# Patient Record
Sex: Male | Born: 1985 | Race: White | Hispanic: No | Marital: Married | Smoking: Current every day smoker
Health system: Southern US, Community
[De-identification: ages and names within clinical notes are randomized; demographics above are authoritative.]

## PROBLEM LIST (undated history)

## (undated) DIAGNOSIS — I10 Essential (primary) hypertension: Secondary | ICD-10-CM

---

## 2015-11-14 ENCOUNTER — Emergency Department (HOSPITAL_COMMUNITY)
Admission: EM | Admit: 2015-11-14 | Discharge: 2015-11-14 | Disposition: A | Discharge status: Home / Self Care | Payer: Worker's Compensation | Attending: Emergency Medicine | Admitting: Emergency Medicine

## 2015-11-14 ENCOUNTER — Encounter (HOSPITAL_COMMUNITY): Payer: Self-pay | Admitting: *Deleted

## 2015-11-14 DIAGNOSIS — F17210 Nicotine dependence, cigarettes, uncomplicated: Secondary | ICD-10-CM | POA: Diagnosis not present

## 2015-11-14 DIAGNOSIS — Y9289 Other specified places as the place of occurrence of the external cause: Secondary | ICD-10-CM | POA: Diagnosis not present

## 2015-11-14 DIAGNOSIS — Y9389 Activity, other specified: Secondary | ICD-10-CM | POA: Diagnosis not present

## 2015-11-14 DIAGNOSIS — S0990XA Unspecified injury of head, initial encounter: Secondary | ICD-10-CM

## 2015-11-14 DIAGNOSIS — W228XXA Striking against or struck by other objects, initial encounter: Secondary | ICD-10-CM | POA: Insufficient documentation

## 2015-11-14 DIAGNOSIS — Y998 Other external cause status: Secondary | ICD-10-CM | POA: Diagnosis not present

## 2015-11-14 NOTE — ED Provider Notes (Signed)
CSN: [id number]     Arrival date & time 11/14/15  1016 History  By signing my name below, I, Ronney LionSuzanne Le, attest that this documentation has been prepared under the direction and in the presence of Holly Iannaccone, PA-C. Electronically Signed: Ronney LionSuzanne Le, ED Scribe. 11/14/2015. 1:59 PM.    Chief Complaint  Patient presents with  . Head Injury   The history is provided by the patient. No language interpreter was used.   HPI Comments: Marc Cole is a 29 y.o. male with no pertinent PMHx, who presents to the Emergency Department S/P accidentally hitting the posterior and top of his head on a window while working in a crawl space at work. Patient states that shortly after the injury, clear-yellow-red fluid ran out of his right nostril 3 times after bending over to pick something up, which concerned him enough to come to the ED for evaluation. He states that his symptoms have since resolved. He denies LOC, visual changes, or neck pain. He states this is a new problem. He notes a history of seasonal allergies but has not noticed any rhinorrhea. Patient is here with his boss from work who requests urine sample for workers compensation.   History reviewed. No pertinent past medical history. History reviewed. No pertinent past surgical history. No family history on file. Social History  Substance Use Topics  . Smoking status: Current Some Day Smoker -- 0.01 packs/day    Types: Cigarettes  . Smokeless tobacco: None  . Alcohol Use: Yes     Comment: occassional    Review of Systems  HENT: Negative for rhinorrhea.   Eyes: Negative for visual disturbance.  Musculoskeletal: Negative for neck pain.      Allergies  Review of patient's allergies indicates no known allergies.  Home Medications   Prior to Admission medications   Not on File   BP 140/93 mmHg  Pulse 105  Temp(Src) 97.8 F (36.6 C) (Oral)  Resp 18  SpO2 100% Physical Exam  Constitutional: He is oriented to person, place,  and time. He appears well-developed and well-nourished. No distress.  HENT:  Head: Normocephalic and atraumatic.  Nose: Nose normal.  No hematoma. No epistaxis. No nasal mucosal edema.   Eyes: Conjunctivae are normal. Pupils are equal, round, and reactive to light. Right eye exhibits no discharge. Left eye exhibits no discharge. No scleral icterus.  Neck: Normal range of motion. Neck supple.  No midline spinal tenderness.  Cardiovascular: Normal rate.   Pulmonary/Chest: Effort normal.  Lymphadenopathy:    He has no cervical adenopathy.  Neurological: He is alert and oriented to person, place, and time. No cranial nerve deficit. He exhibits normal muscle tone. Coordination normal.  Strength 5/5 throughout. No sensory deficits.  No gait abnormality. Normal finger to nose. Normal heel to shin.   Skin: Skin is warm and dry. No rash noted. He is not diaphoretic. No erythema. No pallor.  Psychiatric: He has a normal mood and affect. His behavior is normal.  Nursing note and vitals reviewed.   ED Course  Procedures (including critical care time)  DIAGNOSTIC STUDIES: Oxygen Saturation is 100% on RA, normal by my interpretation.    COORDINATION OF CARE: 1:12 PM - Suspect sinus drainage. Pt reassured.    MDM   Final diagnoses:  Head injury, initial encounter   Pt presents after striking his head while crawling out of a  Crawl space at work. No LOC, Head is atraumatic. Denies headache. Low mechanism of injury. Cervical spine is non-tender  with normal ROM. No epistaxis or mucosal edema. No advanced imaging indicated at this time. Pt appears well in ED, non-toxic. Alert and Oriented x4. Feel that pt is safe for discarge with PCP follow up. Return precautions outlined in patient discharge instructions.    I personally performed the services described in this documentation, which was scribed in my presence. The recorded information has been reviewed and is accurate.       Lester Kinsman  [place], PA-C 11/15/15 1214  Cathren Laine, MD 11/16/15 [phone]

## 2015-11-14 NOTE — ED Notes (Signed)
Pt reports hitting head on window today while at work, pt denies LOC, pt denies blurred vision, pt reports mod amt of clear & yellowish drainage from nose x 3 post hitting, pt denies neck & back pain, pt A&O x4, follows commands, speaks in complete sentences, pt moves all extremities, no bleeding, swelling or bruising noted to the location the pt hit his head, pt denies bowel & bladder incontinence

## 2015-11-14 NOTE — Discharge Instructions (Signed)
Head Injury, Adult You have a head injury. Headaches and throwing up (vomiting) are common after a head injury. It should be easy to wake up from sleeping. Sometimes you must stay in the hospital. Most problems happen within the first 24 hours. Side effects may occur up to 7-10 days after the injury.  WHAT ARE THE TYPES OF HEAD INJURIES? Head injuries can be as minor as a bump. Some head injuries can be more severe. More severe head injuries include:  A jarring injury to the brain (concussion).  A bruise of the brain (contusion). This mean there is bleeding in the brain that can cause swelling.  A cracked skull (skull fracture).  Bleeding in the brain that collects, clots, and forms a bump (hematoma). WHEN SHOULD I GET HELP RIGHT AWAY?   You are confused or sleepy.  You cannot be woken up.  You feel sick to your stomach (nauseous) or keep throwing up (vomiting).  Your dizziness or unsteadiness is getting worse.  You have very bad, lasting headaches that are not helped by medicine. Take medicines only as told by your doctor.  You cannot use your arms or legs like normal.  You cannot walk.  You notice changes in the black spots in the center of the colored part of your eye (pupil).  You have clear or bloody fluid coming from your nose or ears.  You have trouble seeing. During the next 24 hours after the injury, you must stay with someone who can watch you. This person should get help right away (call 911 in the [place] if you start to shake and are not able to control it (have seizures), you pass out, or you are unable to wake up. HOW CAN I PREVENT A HEAD INJURY IN THE FUTURE?  Wear seat belts.  Wear a helmet while bike riding and playing sports like football.  Stay away from dangerous activities around the house. WHEN CAN I RETURN TO NORMAL ACTIVITIES AND ATHLETICS? See your doctor before doing these activities. You should not do normal activities or play contact sports until 1  week after the following symptoms have stopped:  Headache that does not go away.  Dizziness.  Poor attention.  Confusion.  Memory problems.  Sickness to your stomach or throwing up.  Tiredness.  Fussiness.  Bothered by bright lights or loud noises.  Anxiousness or depression.  Restless sleep. MAKE SURE YOU:   Understand these instructions.  Will watch your condition.  Will get help right away if you are not doing well or get worse.   This information is not intended to replace advice given to you by your health care provider. Make sure you discuss any questions you have with your health care provider.  Follow up with your primary care provider if your symptoms do not improve. Return to the ED if you experience severe headache, blurry vision, loss of consciousness, severe nose bleed, facial pain, numbness/tingling in an extremity.

## 2015-11-14 NOTE — ED Notes (Signed)
See PA assessment 

## 2017-03-30 ENCOUNTER — Emergency Department (INDEPENDENT_AMBULATORY_CARE_PROVIDER_SITE_OTHER)
Admission: EM | Admit: 2017-03-30 | Discharge: 2017-03-30 | Disposition: A | Discharge status: Home / Self Care | Payer: Managed Care, Other (non HMO) | Source: Home / Self Care | Attending: Family Medicine | Admitting: Family Medicine

## 2017-03-30 ENCOUNTER — Encounter: Payer: Self-pay | Admitting: Emergency Medicine

## 2017-03-30 DIAGNOSIS — L03114 Cellulitis of left upper limb: Secondary | ICD-10-CM | POA: Diagnosis not present

## 2017-03-30 LAB — POCT CBC W AUTO DIFF (K'VILLE URGENT CARE)

## 2017-03-30 MED ORDER — TRIAMCINOLONE ACETONIDE 0.1 % EX CREA: 1.0000 "application " | TOPICAL_CREAM | Freq: Two times a day (BID) | CUTANEOUS | 0 refills | Status: DC

## 2017-03-30 MED ORDER — CLINDAMYCIN HCL 300 MG PO CAPS: 300.0000 mg | ORAL_CAPSULE | Freq: Three times a day (TID) | ORAL | 0 refills | Status: AC

## 2017-03-30 NOTE — ED Triage Notes (Signed)
[facility] with a complaint of rash on both arms, redness around wrist and elbows, itching present. C/o arm swelling

## 2017-03-30 NOTE — Discharge Instructions (Signed)
Stop cephalexin and begin Clindamycin. May take Benadryl at bedtime for itching if needed.

## 2017-03-30 NOTE — ED Provider Notes (Signed)
[facility]    CSN: [id number] Arrival date & time: 03/30/17  1411     History   Chief Complaint Chief Complaint  Patient presents with  . Rash    HPI Ashley MarinerKevin Lindeman is a 31 y.o. male.   Patient complains of a persistent pruritic rash on both forearms and wrists that started about 8 days ago.  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  He visited a local urgent care 1 week ago where he received an injection of Decadron 10mg  and Kenalog 40mg , and prescribed triamcinolone cream.  His rash improved somewhat, and then became worse with increased warmth and erythema.  He returned to urgent care where he received an injection of Rocephin and started on Keflex 500mg  QID.  He was advised to continue to apply triamcinolone cream.  He reports that that the rash has improved but is still erythematous and itches.  He feels well otherwise, and denies fevers, chills, and sweats.   The history is provided by the patient.  Rash  Location: both forearms. Quality: dryness, itchiness and redness   Quality: not blistering, not draining, not painful, not swelling and not weeping   Severity:  Moderate Onset quality:  Gradual Duration:  8 days Timing:  Constant Progression:  Improving Chronicity:  New Context: plant contact   Context: not animal contact, not chemical exposure, not insect bite/sting, not medications and not new detergent/soap   Relieved by:  Nothing Ineffective treatments:  Topical steroids Associated symptoms: no abdominal pain, no diarrhea, no fatigue, no fever, no headaches, no induration, no joint pain, no myalgias and no nausea     History reviewed. No pertinent past medical history.  There are no active problems to display for this patient.   History reviewed. No pertinent surgical history.     Home Medications    Prior to Admission medications     Medication Sig Start Date End Date Taking? Authorizing Provider  clindamycin (CLEOCIN) 300 MG capsule Take 1 capsule (300 mg total) by mouth 3 (three) times daily. 03/30/17 04/09/17  Lattie HawBeese, Freddi Forster A, MD  triamcinolone cream (KENALOG) 0.1 % Apply 1 application topically 2 (two) times daily. (Use as needed for itching) 03/30/17   Lattie HawBeese, Veralyn Lopp A, MD    Family History History reviewed. No pertinent family history.  Social History Social History  Substance Use Topics  . Smoking status: Current Some Day Smoker    Packs/day: 0.01    Types: Cigarettes  . Smokeless tobacco: Current User  . Alcohol use Yes     Comment: occassional     Allergies   Patient has no known allergies.   Review of Systems Review of Systems  Constitutional: Negative for activity change, appetite change, chills, diaphoresis, fatigue and fever.  HENT: Negative.   Eyes: Negative.   Respiratory: Negative.   Cardiovascular: Negative.   Gastrointestinal: Negative for abdominal pain, diarrhea and nausea.  Genitourinary: Negative.   Musculoskeletal: Negative for arthralgias and myalgias.  Skin: Positive for rash.  Neurological: Negative for headaches.  All other systems reviewed and are negative.    Physical Exam Triage Vital Signs ED Triage Vitals  Enc Vitals Group     BP 03/30/17 1445 133/83     Pulse Rate 03/30/17 1445 (!) 107     Resp --      Temp 03/30/17 1445 98.6 F (37 C)  Temp Source 03/30/17 1445 Oral     SpO2 03/30/17 1445 99 %     Weight --      Height --      Head Circumference --      Peak Flow --      Pain Score 03/30/17 1446 0     Pain Loc --      Pain Edu? --      Excl. in GC? --    No data found.   Updated Vital Signs BP 133/83 (BP Location: Right Arm)   Pulse (!) 107   Temp 98.6 F (37 C) (Oral)   SpO2 99%   Visual Acuity Right Eye Distance:   Left Eye Distance:   Bilateral Distance:    Right Eye Near:   Left Eye Near:    Bilateral Near:     Physical Exam   Constitutional: He appears well-developed and well-nourished. No distress.  HENT:  Head: Normocephalic.  Right Ear: External ear normal.  Left Ear: External ear normal.  Nose: Nose normal.  Mouth/Throat: Oropharynx is clear and moist.  Eyes: Conjunctivae are normal. Pupils are equal, round, and reactive to light.  Neck: Neck supple.  Cardiovascular: Regular rhythm.   Note tacycardia rate 107  Pulmonary/Chest: Effort normal.  Musculoskeletal:       Right forearm: He exhibits no tenderness and no swelling.       Arms: Left forearm has light erythema over volar surface extending to antecubital fossa and biceps area.  There is mild warmth but no tenderness, induration, or fluctuance. Right forearm has minimal erythema present.  Lymphadenopathy:    He has no cervical adenopathy.  Neurological: He is alert.  Skin: Skin is warm and dry.  Nursing note and vitals reviewed.    UC Treatments / Results  Labs (all labs ordered are listed, but only abnormal results are displayed) Labs Reviewed  POCT CBC W AUTO DIFF ([facility]):  WBC 11.9; LY 17.6; MO 2.2; GR 80.2; Hgb 15.5; Platelets 273     EKG  EKG Interpretation None       Radiology No results found.  Procedures Procedures (including critical care time)  Medications Ordered in UC Medications - No data to display   Initial Impression / Assessment and Plan / UC Course  I have reviewed the triage vital signs and the nursing notes.  Pertinent labs & imaging results that were available during my care of the patient were reviewed by me and considered in my medical decision making (see chart for details).    Note leukocytosis 11.9.  Concern for partially treated possible staph cellulitis. Stop cephalexin and begin Clindamycin for staph coverage. Refill triamcinolone cream. May take Benadryl at bedtime for itching if needed. Followup with dermatologist if not improving 3 days.    Final Clinical Impressions(s) /  UC Diagnoses   Final diagnoses:  Cellulitis of left arm    New Prescriptions New Prescriptions   CLINDAMYCIN (CLEOCIN) 300 MG CAPSULE    Take 1 capsule (300 mg total) by mouth 3 (three) times daily.   TRIAMCINOLONE CREAM (KENALOG) 0.1 %    Apply 1 application topically 2 (two) times daily. (Use as needed for itching)     Lattie Haw, MD 04/09/17 1040

## 2017-05-28 ENCOUNTER — Encounter: Payer: Self-pay | Admitting: *Deleted

## 2017-05-28 ENCOUNTER — Emergency Department (INDEPENDENT_AMBULATORY_CARE_PROVIDER_SITE_OTHER)
Admission: EM | Admit: 2017-05-28 | Discharge: 2017-05-28 | Disposition: A | Discharge status: Home / Self Care | Payer: Managed Care, Other (non HMO) | Source: Home / Self Care | Attending: Family Medicine | Admitting: Family Medicine

## 2017-05-28 DIAGNOSIS — R21 Rash and other nonspecific skin eruption: Secondary | ICD-10-CM

## 2017-05-28 HISTORY — DX: Essential (primary) hypertension: I10

## 2017-05-28 MED ORDER — METHYLPREDNISOLONE ACETATE 80 MG/ML IJ SUSP
80.0000 mg | Freq: Once | INTRAMUSCULAR | Status: AC
  Administered 2017-05-28: 80 mg via INTRAMUSCULAR

## 2017-05-28 MED ORDER — DOXYCYCLINE HYCLATE 100 MG PO CAPS: 100.0000 mg | ORAL_CAPSULE | Freq: Two times a day (BID) | ORAL | 0 refills | Status: DC

## 2017-05-28 NOTE — ED Triage Notes (Signed)
Pt c/o itching rash on his face and neck x 1 day.

## 2017-05-28 NOTE — ED Provider Notes (Signed)
[facility]    CSN: [id number] Arrival date & time: 05/28/17  1010     History   Chief Complaint Chief Complaint  Patient presents with  . Rash    HPI Marc Cole is a 31 y.o. male.   Patient awoke yesterday with a pruritic rash on his lateral neck and behind ears.  Today he noticed similar rash on his cheeks.  He states that he had been in woods last week, but recalls no plant contact.  Four days ago he was outside in the sun, on a water slide.  No hot tubs.   The history is provided by the patient.  Rash  Location: neck and cheeks. Quality: dryness, itchiness and redness   Quality: not blistering, not bruising, not burning, not draining, not painful, not peeling, not scaling, not swelling and not weeping   Severity:  Mild Onset quality:  Sudden Duration:  1 day Timing:  Constant Progression:  Worsening Chronicity:  New Context: sun exposure   Context: not animal contact, not chemical exposure, not exposure to similar rash, not food, not hot tub use, not insect bite/sting, not medications, not new detergent/soap, not nuts and not plant contact   Relieved by:  None tried Worsened by:  Nothing Ineffective treatments:  None tried Associated symptoms: no diarrhea, no fatigue, no fever, no headaches, no induration, no joint pain, no myalgias, no nausea, no sore throat, no throat swelling and no tongue swelling     Past Medical History:  Diagnosis Date  . Hypertension     There are no active problems to display for this patient.   History reviewed. No pertinent surgical history.     Home Medications    Prior to Admission medications   Medication Sig Start Date End Date Taking? Authorizing Provider  doxycycline (VIBRAMYCIN) 100 MG capsule Take 1 capsule (100 mg total) by mouth 2 (two) times daily. Take with food. 05/28/17   Lattie HawBeese, Stephen A, MD    Family History Family History  Problem Relation Age of Onset  . Hypertension Father     Social  History Social History  Substance Use Topics  . Smoking status: Current Every Day Smoker    Packs/day: 1.00    Types: Cigarettes  . Smokeless tobacco: Current User  . Alcohol use Yes     Comment: 10 q wk     Allergies   Patient has no known allergies.   Review of Systems Review of Systems  Constitutional: Negative for fatigue and fever.  HENT: Negative for sore throat.   Gastrointestinal: Negative for diarrhea and nausea.  Musculoskeletal: Negative for arthralgias and myalgias.  Skin: Positive for rash.  Neurological: Negative for headaches.  All other systems reviewed and are negative.    Physical Exam Triage Vital Signs ED Triage Vitals [05/28/17 1028]  Enc Vitals Group     BP (!) 151/90     Pulse Rate (!) 114     Resp 16     Temp 98.4 F (36.9 C)     Temp Source Oral     SpO2 99 %     Weight 170 lb (77.1 kg)     Height 5\' 7"  (1.702 m)     Head Circumference      Peak Flow      Pain Score 0     Pain Loc      Pain Edu?      Excl. in GC?    No data found.   Updated  Vital Signs BP (!) 151/90 (BP Location: Left Arm)   Pulse (!) 114   Temp 98.4 F (36.9 C) (Oral)   Resp 16   Ht 5\' 7"  (1.702 m)   Wt 170 lb (77.1 kg)   SpO2 99%   BMI 26.63 kg/m   Visual Acuity Right Eye Distance:   Left Eye Distance:   Bilateral Distance:    Right Eye Near:   Left Eye Near:    Bilateral Near:     Physical Exam  Constitutional: He appears well-developed and well-nourished. No distress.  HENT:  Head: Normocephalic.    Right Ear: External ear normal.  Left Ear: External ear normal.  Nose: Nose normal.  Mouth/Throat: Oropharynx is clear and moist.  Confluent erythema on bilateral neck beneath and behind ears, and similar, milder eruption on cheeks.  Tiny follicular vesicles are present.  Eyes: Conjunctivae are normal. Pupils are equal, round, and reactive to light. Right eye exhibits no discharge. Left eye exhibits no discharge.  Neck: Neck supple.    Cardiovascular: Normal rate.   Pulmonary/Chest: Effort normal.  Musculoskeletal: He exhibits no edema.  Lymphadenopathy:    He has no cervical adenopathy.  Neurological: He is alert.  Skin: Skin is warm and dry.  Nursing note and vitals reviewed.    UC Treatments / Results  Labs (all labs ordered are listed, but only abnormal results are displayed) Labs Reviewed - No data to display  EKG  EKG Interpretation None       Radiology No results found.  Procedures Procedures (including critical care time)  Medications Ordered in UC Medications  methylPREDNISolone acetate (DEPO-MEDROL) injection 80 mg (80 mg Intramuscular Given 05/28/17 1053)     Initial Impression / Assessment and Plan / UC Course  I have reviewed the triage vital signs and the nursing notes.  Pertinent labs & imaging results that were available during my care of the patient were reviewed by me and considered in my medical decision making (see chart for details).    Rash has characteristics of both folliculitis and heat rash, and possibly contact dermatitis. Administered Depo Medrol 80mg  IM  Begin doxycycline 100mg  BID for staph coverage. May take antihistamine (Benadryl, or non-sedating antihistamine like Zyrtec) as needed for itching. Followup with dermatologist if not improved one week.    Final Clinical Impressions(s) / UC Diagnoses   Final diagnoses:  Rash and nonspecific skin eruption    New Prescriptions New Prescriptions   DOXYCYCLINE (VIBRAMYCIN) 100 MG CAPSULE    Take 1 capsule (100 mg total) by mouth 2 (two) times daily. Take with food.     Lattie Haw, MD 05/28/17 1134

## 2017-05-28 NOTE — Discharge Instructions (Signed)
May take antihistamine (Benadryl, or non-sedating antihistamine like Zyrtec) as needed for itching.

## 2017-06-28 ENCOUNTER — Emergency Department (INDEPENDENT_AMBULATORY_CARE_PROVIDER_SITE_OTHER)
Admission: EM | Admit: 2017-06-28 | Discharge: 2017-06-28 | Disposition: A | Discharge status: Home / Self Care | Payer: Managed Care, Other (non HMO) | Source: Home / Self Care | Attending: Family Medicine | Admitting: Family Medicine

## 2017-06-28 ENCOUNTER — Emergency Department (INDEPENDENT_AMBULATORY_CARE_PROVIDER_SITE_OTHER): Payer: Managed Care, Other (non HMO)

## 2017-06-28 ENCOUNTER — Encounter: Payer: Self-pay | Admitting: Emergency Medicine

## 2017-06-28 DIAGNOSIS — M79601 Pain in right arm: Secondary | ICD-10-CM | POA: Diagnosis not present

## 2017-06-28 DIAGNOSIS — M79644 Pain in right finger(s): Secondary | ICD-10-CM

## 2017-06-28 DIAGNOSIS — R0789 Other chest pain: Secondary | ICD-10-CM | POA: Diagnosis not present

## 2017-06-28 DIAGNOSIS — R079 Chest pain, unspecified: Secondary | ICD-10-CM
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
## 2017-06-28 NOTE — Discharge Instructions (Signed)
° °  You may take 500mg  (up to 1000mg  for first dose) acetaminophen every 4-6 hours or in combination with ibuprofen 400-600mg  (up to 800mg  for first dose) every 6-8 hours as needed for pain and inflammation.
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
## 2018-05-30 ENCOUNTER — Emergency Department (INDEPENDENT_AMBULATORY_CARE_PROVIDER_SITE_OTHER)
Admission: EM | Admit: 2018-05-30 | Discharge: 2018-05-30 | Disposition: A | Discharge status: Home / Self Care | Payer: Managed Care, Other (non HMO) | Source: Home / Self Care

## 2018-05-30 ENCOUNTER — Encounter: Payer: Self-pay | Admitting: Emergency Medicine

## 2018-05-30 DIAGNOSIS — J0140 Acute pansinusitis, unspecified: Secondary | ICD-10-CM | POA: Diagnosis not present

## 2018-05-30 MED ORDER — IPRATROPIUM BROMIDE 0.03 % NA SOLN: 2.0000 | Freq: Two times a day (BID) | NASAL | 12 refills | Status: DC

## 2018-05-30 MED ORDER — AMOXICILLIN-POT CLAVULANATE 875-125 MG PO TABS: 1.0000 | ORAL_TABLET | Freq: Two times a day (BID) | ORAL | 0 refills | Status: AC

## 2018-05-30 NOTE — ED Provider Notes (Signed)
[facility]    CSN: [id number] Arrival date & time: 05/30/18  1355     History   Chief Complaint Chief Complaint  Patient presents with  . Facial Pain    HPI Marc Cole is a 32 y.o. male evaluation of facial pressure, nasal congestion, sore throat, and headache. Headache has been present for over 1 week.  Patient reports he has a history of chronic allergies although has not been consistently taking recommended allergy medications.  He also reports a distant history of hypertension-he was diagnosed at age 37 years old and previously prescribed lisinopril and metoprolol for which he has not taken several years.  He reports missing work last night due to the intensity of his headache and massive amount of nasal drainage.  He took 1 dose of Zyrtec and reports feeling improvement this morning although symptoms have not 100% resolved.  He denies fever, chills, nausea, or vomiting.  He has a sick child in the home with URI symptoms. Past Medical History:  Diagnosis Date  . Hypertension       Home Medications    Prior to Admission medications   Medication Sig Start Date End Date Taking? Authorizing Provider  doxycycline (VIBRAMYCIN) 100 MG capsule Take 1 capsule (100 mg total) by mouth 2 (two) times daily. Take with food. 05/28/17   Lattie Haw, MD    Family History Family History  Problem Relation Age of Onset  . Hypertension [facility]     Social History Social History   Tobacco Use  . Smoking status: Current Every Day Smoker    Packs/day: 1.00    Types: Cigarettes  . Smokeless tobacco: Current User  Substance Use Topics  . Alcohol use: Yes    Comment: 20 q wk  . Drug use: No     Allergies   Patient has no known allergies.   Review of Systems Review of Systems   Physical Exam Triage Vital Signs ED Triage Vitals  Enc Vitals Group     BP 05/30/18 1414 (!) 142/92     Pulse Rate 05/30/18 1414 100     Resp 05/30/18 1414 16     Temp 05/30/18  1414 98.8 F (37.1 C)     Temp Source 05/30/18 1414 Oral     SpO2 05/30/18 1414 100 %     Weight 05/30/18 1415 181 lb 8 oz (82.3 kg)     Height 05/30/18 1415 5\' 7"  (1.702 m)     Head Circumference --      Peak Flow --      Pain Score 05/30/18 1415 5     Pain Loc --      Pain Edu? --      Excl. in GC? --    No data found.  Updated Vital Signs BP (!) 142/92 (BP Location: Right Arm)   Pulse 100   Temp 98.8 F (37.1 C) (Oral)   Resp 16   Ht 5\' 7"  (1.702 m)   Wt 181 lb 8 oz (82.3 kg)   SpO2 100%   BMI 28.43 kg/m   Visual Acuity Right Eye Distance:   Left Eye Distance:   Bilateral Distance:    Right Eye Near:   Left Eye Near:    Bilateral Near:     Physical Exam  Constitutional: He is oriented to person, place, and time. He appears ill.  HENT:  Head: Normocephalic.  Right Ear: External ear normal.  Left Ear: External ear normal.  Nose: Mucosal  edema and rhinorrhea present. Right sinus exhibits maxillary sinus tenderness and frontal sinus tenderness. Left sinus exhibits maxillary sinus tenderness.  Mouth/Throat: Posterior oropharyngeal erythema present. No oropharyngeal exudate or posterior oropharyngeal edema.  Cardiovascular: Normal rate, regular rhythm and normal heart sounds.  Pulmonary/Chest: Effort normal and breath sounds normal.  Neurological: He is alert and oriented to person, place, and time. Coordination normal.  Skin: Skin is warm and dry.   UC Treatments / Results  Labs (all labs ordered are listed, but only abnormal results are displayed) Labs Reviewed - No data to display  EKG None  Radiology No results found.  Procedures Procedures (including critical care time)  Medications Ordered in UC Medications - No data to display  Initial Impression / Assessment and Plan / UC Course  I have reviewed the triage vital signs and the nursing notes.  Pertinent labs & imaging results that were available during my care of the patient were reviewed by me  and considered in my medical decision making (see chart for details).  Patient presents today slightly ill-appearing with a complaint of ongoing sinus symptoms along with headache over the course of the last 2 weeks.  He has missed work 2 days due to the severity of his symptoms.  We will treat him for an uncomplicated course of acute sinusitis by prescribing a broad-spectrum antibiotic therapy for 10 days and Atrovent nasal spray for persistent nasal congestion..  Patient advised if symptoms worsen or do not improve to return for follow-up.  Patient verbalized understanding. Work excuse provided allow patient to return to work at next scheduled day for an employment. Final Clinical Impressions(s) / UC Diagnoses   Final diagnoses:  Acute pansinusitis, recurrence not specified     Discharge Instructions     I encourage that you drink plenty of water to silicate hydration.  Take all medication as prescribed.    ED Prescriptions    Medication Sig Dispense Auth. Provider   amoxicillin-clavulanate (AUGMENTIN) 875-125 MG tablet Take 1 tablet by mouth 2 (two) times daily for 10 days. 20 tablet Bing NeighborsHarris, Chery Giusto S, FNP   ipratropium (ATROVENT) 0.03 % nasal spray Place 2 sprays into both nostrils every 12 (twelve) hours. 30 mL Bing NeighborsHarris, Breely Panik S, FNP     Controlled Substance Prescriptions [place] Controlled Substance Registry consulted? Not Applicable   Bing NeighborsHarris, Zimere Dunlevy S, FNP 06/01/18 1340

## 2018-05-30 NOTE — ED Triage Notes (Signed)
[facility] headache, facial pain sinus /nasal congestion times 7 days denies fever.

## 2018-05-30 NOTE — Discharge Instructions (Signed)
I encourage that you drink plenty of water to silicate hydration.  Take all medication as prescribed.

## 2018-06-18 ENCOUNTER — Encounter: Payer: Self-pay | Admitting: Emergency Medicine

## 2018-06-18 ENCOUNTER — Emergency Department (INDEPENDENT_AMBULATORY_CARE_PROVIDER_SITE_OTHER)
Admission: EM | Admit: 2018-06-18 | Discharge: 2018-06-18 | Disposition: A | Discharge status: Home / Self Care | Payer: Managed Care, Other (non HMO) | Source: Home / Self Care | Attending: Family Medicine | Admitting: Family Medicine

## 2018-06-18 ENCOUNTER — Emergency Department (INDEPENDENT_AMBULATORY_CARE_PROVIDER_SITE_OTHER): Payer: Managed Care, Other (non HMO)

## 2018-06-18 DIAGNOSIS — R0602 Shortness of breath: Secondary | ICD-10-CM

## 2018-06-18 DIAGNOSIS — R059 Cough, unspecified: Secondary | ICD-10-CM

## 2018-06-18 DIAGNOSIS — R05 Cough: Secondary | ICD-10-CM | POA: Diagnosis not present

## 2018-06-18 MED ORDER — BENZONATATE 100 MG PO CAPS: 100.0000 mg | ORAL_CAPSULE | Freq: Three times a day (TID) | ORAL | 0 refills | Status: DC

## 2018-06-18 NOTE — Discharge Instructions (Signed)
°  Please follow up with family medicine if not improving in 1 week, sooner if worsening.

## 2018-06-18 NOTE — ED Provider Notes (Signed)
[facility]    CSN: [id number] Arrival date & time: 06/18/18  1207     History   Chief Complaint Chief Complaint  Patient presents with  . Cough    HPI Marc Cole is a 32 y.o. male.   HPI  Marc Cole is a 32 y.o. male presenting to UC with c/o 2-3 days of cough with mild SOB. Hx of sinusitis about 2 weeks ago. He was prescribed Augmentin but stopped taking it due to stomach upset. His sinus pain and pressure has resolved but he is concerned about pneumonia. No prior personal hx of pneumonia but he notes a family member was recently in the ICU for it and it has his concerned. Denies fever, chills, n/v/d.    Past Medical History:  Diagnosis Date  . Hypertension     There are no active problems to display for this patient.   History reviewed. No pertinent surgical history.     Home Medications    Prior to Admission medications   Medication Sig Start Date End Date Taking? Authorizing Provider  benzonatate (TESSALON) 100 MG capsule Take 1-2 capsules (100-200 mg total) by mouth every 8 (eight) hours. 06/18/18   Lurene ShadowPhelps, Dera Vanaken O, PA-C  ipratropium (ATROVENT) 0.03 % nasal spray Place 2 sprays into both nostrils every 12 (twelve) hours. 05/30/18   Bing NeighborsHarris, Kimberly S, FNP    Family History Family History  Problem Relation Age of Onset  . Hypertension Father     Social History Social History   Tobacco Use  . Smoking status: Current Every Day Smoker    Packs/day: 1.00    Types: Cigarettes  . Smokeless tobacco: Current User  Substance Use Topics  . Alcohol use: Yes    Comment: 20 q wk  . Drug use: No     Allergies   Patient has no known allergies.   Review of Systems Review of Systems  Constitutional: Negative for chills and fever.  HENT: Positive for congestion. Negative for ear pain, sore throat, trouble swallowing and voice change.   Respiratory: Positive for cough and shortness of breath. Negative for chest tightness and wheezing.     Cardiovascular: Negative for chest pain and palpitations.  Gastrointestinal: Negative for abdominal pain, diarrhea, nausea and vomiting.  Musculoskeletal: Negative for arthralgias, back pain and myalgias.  Skin: Negative for rash.  Neurological: Negative for dizziness and headaches.     Physical Exam Triage Vital Signs ED Triage Vitals  Enc Vitals Group     BP 06/18/18 1233 (!) 147/93     Pulse Rate 06/18/18 1233 92     Resp --      Temp 06/18/18 1233 98 F (36.7 C)     Temp Source 06/18/18 1233 Oral     SpO2 06/18/18 1233 100 %     Weight 06/18/18 1234 180 lb (81.6 kg)     Height --      Head Circumference --      Peak Flow --      Pain Score 06/18/18 1234 0     Pain Loc --      Pain Edu? --      Excl. in GC? --    No data found.  Updated Vital Signs BP (!) 147/93 (BP Location: Right Arm)   Pulse 92   Temp 98 F (36.7 C) (Oral)   Wt 180 lb (81.6 kg)   SpO2 100%   BMI 28.19 kg/m   Visual Acuity Right Eye Distance:  Left Eye Distance:   Bilateral Distance:    Right Eye Near:   Left Eye Near:    Bilateral Near:     Physical Exam  Constitutional: He is oriented to person, place, and time. He appears well-developed and well-nourished. No distress.  HENT:  Head: Normocephalic and atraumatic.  Right Ear: Tympanic membrane normal.  Left Ear: Tympanic membrane normal.  Nose: Nose normal.  Mouth/Throat: Uvula is midline, oropharynx is clear and moist and mucous membranes are normal.  Eyes: EOM are normal.  Neck: Normal range of motion. Neck supple.  Cardiovascular: Normal rate and regular rhythm.  Pulmonary/Chest: Effort normal and breath sounds normal. No stridor. No respiratory distress. He has no wheezes. He has no rales.  Musculoskeletal: Normal range of motion.  Neurological: He is alert and oriented to person, place, and time.  Skin: Skin is warm and dry. He is not diaphoretic.  Psychiatric: He has a normal mood and affect. His behavior is normal.   Nursing note and vitals reviewed.    UC Treatments / Results  Labs (all labs ordered are listed, but only abnormal results are displayed) Labs Reviewed - No data to display  EKG None  Radiology Dg Chest 2 View  Result Date: 06/18/2018 CLINICAL DATA:  Productive cough and shortness of breath for 3 days. EXAM: CHEST - 2 VIEW COMPARISON:  Single-view of the chest 06/28/2017. FINDINGS: Punctate calcified granuloma in the right middle lobe is noted. Lungs otherwise clear. Heart size normal. No pneumothorax or pleural effusion. No bony abnormality. IMPRESSION: Negative chest. Electronically Signed   By: Drusilla Kanner M.D.   On: 06/18/2018 12:51    Procedures Procedures (including critical care time)  Medications Ordered in UC Medications - No data to display  Initial Impression / Assessment and Plan / UC Course  I have reviewed the triage vital signs and the nursing notes.  Pertinent labs & imaging results that were available during my care of the patient were reviewed by me and considered in my medical decision making (see chart for details).     Reassured pt of normal CXR Encouraged symptomatic treatment Home care instructions provided below.   Final Clinical Impressions(s) / UC Diagnoses   Final diagnoses:  Cough in adult patient     Discharge Instructions      Please follow up with family medicine if not improving in 1 week, sooner if worsening.     ED Prescriptions    Medication Sig Dispense Auth. Provider   benzonatate (TESSALON) 100 MG capsule Take 1-2 capsules (100-200 mg total) by mouth every 8 (eight) hours. 21 capsule Lurene Shadow, PA-C     Controlled Substance Prescriptions [place] Controlled Substance Registry consulted? Not Applicable   Rolla Plate 06/18/18 1620

## 2018-06-18 NOTE — ED Triage Notes (Signed)
Pt c/o cough with mucous and SOB x1 week. States he had an URI x2 weeks ago and started feeling better but cough has worsened.

## 2018-10-17 ENCOUNTER — Emergency Department (INDEPENDENT_AMBULATORY_CARE_PROVIDER_SITE_OTHER)
Admission: EM | Admit: 2018-10-17 | Discharge: 2018-10-17 | Disposition: A | Discharge status: Home / Self Care | Payer: Managed Care, Other (non HMO) | Source: Home / Self Care

## 2018-10-17 ENCOUNTER — Other Ambulatory Visit: Payer: Self-pay

## 2018-10-17 DIAGNOSIS — B9789 Other viral agents as the cause of diseases classified elsewhere: Secondary | ICD-10-CM

## 2018-10-17 DIAGNOSIS — J069 Acute upper respiratory infection, unspecified: Secondary | ICD-10-CM

## 2018-10-17 MED ORDER — AZITHROMYCIN 250 MG PO TABS: 250.0000 mg | ORAL_TABLET | Freq: Every day | ORAL | 0 refills | Status: DC

## 2018-10-17 MED ORDER — BENZONATATE 200 MG PO CAPS: 200.0000 mg | ORAL_CAPSULE | Freq: Two times a day (BID) | ORAL | 0 refills | Status: DC | PRN

## 2018-10-17 NOTE — Discharge Instructions (Addendum)
Drink plenty of fluids Consider over-the-counter product like Mucinex to loosen the mucus I have prescribed Tessalon which is a cough suppressant, if you have excessive coughing I have prescribed antibiotic azithromycin to fill and take if you feel worse instead of better or start running a fever Get plenty of rest Return if you are worse instead of better

## 2018-10-17 NOTE — ED Triage Notes (Signed)
Here with worsening cold sx's that started 2 weeks ago. Now having intermit sharp pain across mid chest with gray tinged "chunky" sputum. Denies fever,chils,n,v. Hx chronic smoker.

## 2018-10-17 NOTE — ED Provider Notes (Signed)
[name]    CSN: [id number] Arrival date & time: 10/17/18  1053     History   Chief Complaint Chief Complaint  Patient presents with  . Cough    HPI Marc Cole is a 32 y.o. male.   HPI  Patient is here with a history of a worsening cough over 2-week period of time.  He has some anterior chest pain with deep breathing and with coughing.  He states he coughs worse in the afternoon and evening that he does in the morning.  He is feeling excessively tired at the end of the day.  He states he is coughing up some gray to tan sputum, sometimes with "chunks".  No blood in the mucus.  He does not think he has had fever or chills.  He has no runny nose or sinus symptoms.  He states he does have chronic allergies.  No underlying asthma.  He is a smoker.  He is advised to quit.  Past Medical History:  Diagnosis Date  . Hypertension     There are no active problems to display for this patient.   No past surgical history on file.     Home Medications    Prior to Admission medications   Medication Sig Start Date End Date Taking? Authorizing Provider  azithromycin (ZITHROMAX) 250 MG tablet Take 1 tablet (250 mg total) by mouth daily. Take first 2 tablets together, then 1 every day until finished. 10/17/18   Eustace Moore, MD  benzonatate (TESSALON) 200 MG capsule Take 1 capsule (200 mg total) by mouth 2 (two) times daily as needed for cough. 10/17/18   Eustace Moore, MD  ipratropium (ATROVENT) 0.03 % nasal spray Place 2 sprays into both nostrils every 12 (twelve) hours. 05/30/18   Bing Neighbors, FNP    Family History Family History  Problem Relation Age of Onset  . Hypertension Father     Social History Social History   Tobacco Use  . Smoking status: Current Every Day Smoker    Packs/day: 1.00    Types: Cigarettes  . Smokeless tobacco: Current User  Substance Use Topics  . Alcohol use: Yes    Comment: 20 q wk  . Drug use: No      Allergies   Patient has no known allergies.   Review of Systems Review of Systems  Constitutional: Positive for fatigue. Negative for chills and fever.  HENT: Negative for ear pain and sore throat.   Eyes: Negative for pain and visual disturbance.  Respiratory: Positive for cough. Negative for shortness of breath.   Cardiovascular: Negative for chest pain and palpitations.  Gastrointestinal: Negative for abdominal pain and vomiting.  Genitourinary: Negative for dysuria and hematuria.  Musculoskeletal: Negative for arthralgias and back pain.  Skin: Negative for color change and rash.  Neurological: Negative for seizures and syncope.  All other systems reviewed and are negative.    Physical Exam Triage Vital Signs ED Triage Vitals  Enc Vitals Group     BP 10/17/18 1110 (!) 161/84     Pulse Rate 10/17/18 1110 (!) 105     Resp --      Temp 10/17/18 1110 98.1 F (36.7 C)     Temp src --      SpO2 10/17/18 1110 99 %     Weight 10/17/18 1111 182 lb 6.4 oz (82.7 kg)     Height 10/17/18 1111 5\' 7"  (1.702 m)     Head Circumference --  Peak Flow --      Pain Score 10/17/18 1110 5     Pain Loc --      Pain Edu? --      Excl. in GC? --    No data found.  Updated Vital Signs BP (!) 161/84 (BP Location: Left Arm)   Pulse (!) 105   Temp 98.1 F (36.7 C)   Ht 5\' 7"  (1.702 m)   Wt 82.7 kg   SpO2 99%   BMI 28.57 kg/m       Physical Exam  Constitutional: He appears well-developed and well-nourished. No distress.  HENT:  Head: Normocephalic and atraumatic.  Mouth/Throat: Oropharynx is clear and moist.  Post pharynx mildly red  Eyes: Pupils are equal, round, and reactive to light. Conjunctivae are normal.  Neck: Normal range of motion.  Cardiovascular: Regular rhythm and normal heart sounds.  tachycardia  Pulmonary/Chest: Effort normal and breath sounds normal. No respiratory distress. He has no wheezes. He has no rales.  Abdominal: Soft. He exhibits no  distension.  Musculoskeletal: Normal range of motion. He exhibits no edema.  Lymphadenopathy:    He has no cervical adenopathy.  Neurological: He is alert.  Skin: Skin is warm and dry.  Psychiatric: He has a normal mood and affect. His behavior is normal.     UC Treatments / Results  Labs (all labs ordered are listed, but only abnormal results are displayed) Labs Reviewed - No data to display  EKG None  Radiology No results found.  Procedures Procedures (including critical care time)  Medications Ordered in UC Medications - No data to display  Initial Impression / Assessment and Plan / UC Course  I have reviewed the triage vital signs and the nursing notes.  Pertinent labs & imaging results that were available during my care of the patient were reviewed by me and considered in my medical decision making (see chart for details).     We reviewed that most coughs are caused by a virus.  The cough from viral bronchitis can last for 4 weeks or more.  This may be exacerbated by his smoking.  He is advised to quit.  We discussed antibiotic usage.  He would prefer not to take an antibiotic.  We agreed that I will give him a prescription for an antibiotic to fill and use if he continues to cough, or if he starts running a fever or having worsening symptoms. I discussed that his pulse and blood pressure both mildly elevated.  He states it is always like that it the doctor.  He is advised to follow-up with his PCP for blood pressure review4 Final Clinical Impressions(s) / UC Diagnoses   Final diagnoses:  Viral URI with cough     Discharge Instructions     Drink plenty of fluids Consider over-the-counter product like Mucinex to loosen the mucus I have prescribed Tessalon which is a cough suppressant, if you have excessive coughing I have prescribed antibiotic azithromycin to fill and take if you feel worse instead of better or start running a fever Get plenty of rest Return if  you are worse instead of better   ED Prescriptions    Medication Sig Dispense Auth. Provider   benzonatate (TESSALON) 200 MG capsule Take 1 capsule (200 mg total) by mouth 2 (two) times daily as needed for cough. 20 capsule Eustace Moore, MD   azithromycin (ZITHROMAX) 250 MG tablet Take 1 tablet (250 mg total) by mouth daily. Take first 2 tablets  together, then 1 every day until finished. 6 tablet Eustace MooreNelson, Ryleigh Buenger Sue, MD     Controlled Substance Prescriptions [place] Controlled Substance Registry consulted? Not Applicable   Eustace MooreNelson, Nandika Stetzer Sue, MD 10/17/18 1133

## 2018-10-26 IMAGING — DX DG CHEST 2V
2 series · 2 of 2 positions shown · non-contrast
Comparison: Single-view of the chest 06/28/2017.

CLINICAL DATA: Productive cough and shortness of breath for 3 days.

EXAM:
CHEST - 2 VIEW

[chest pa]
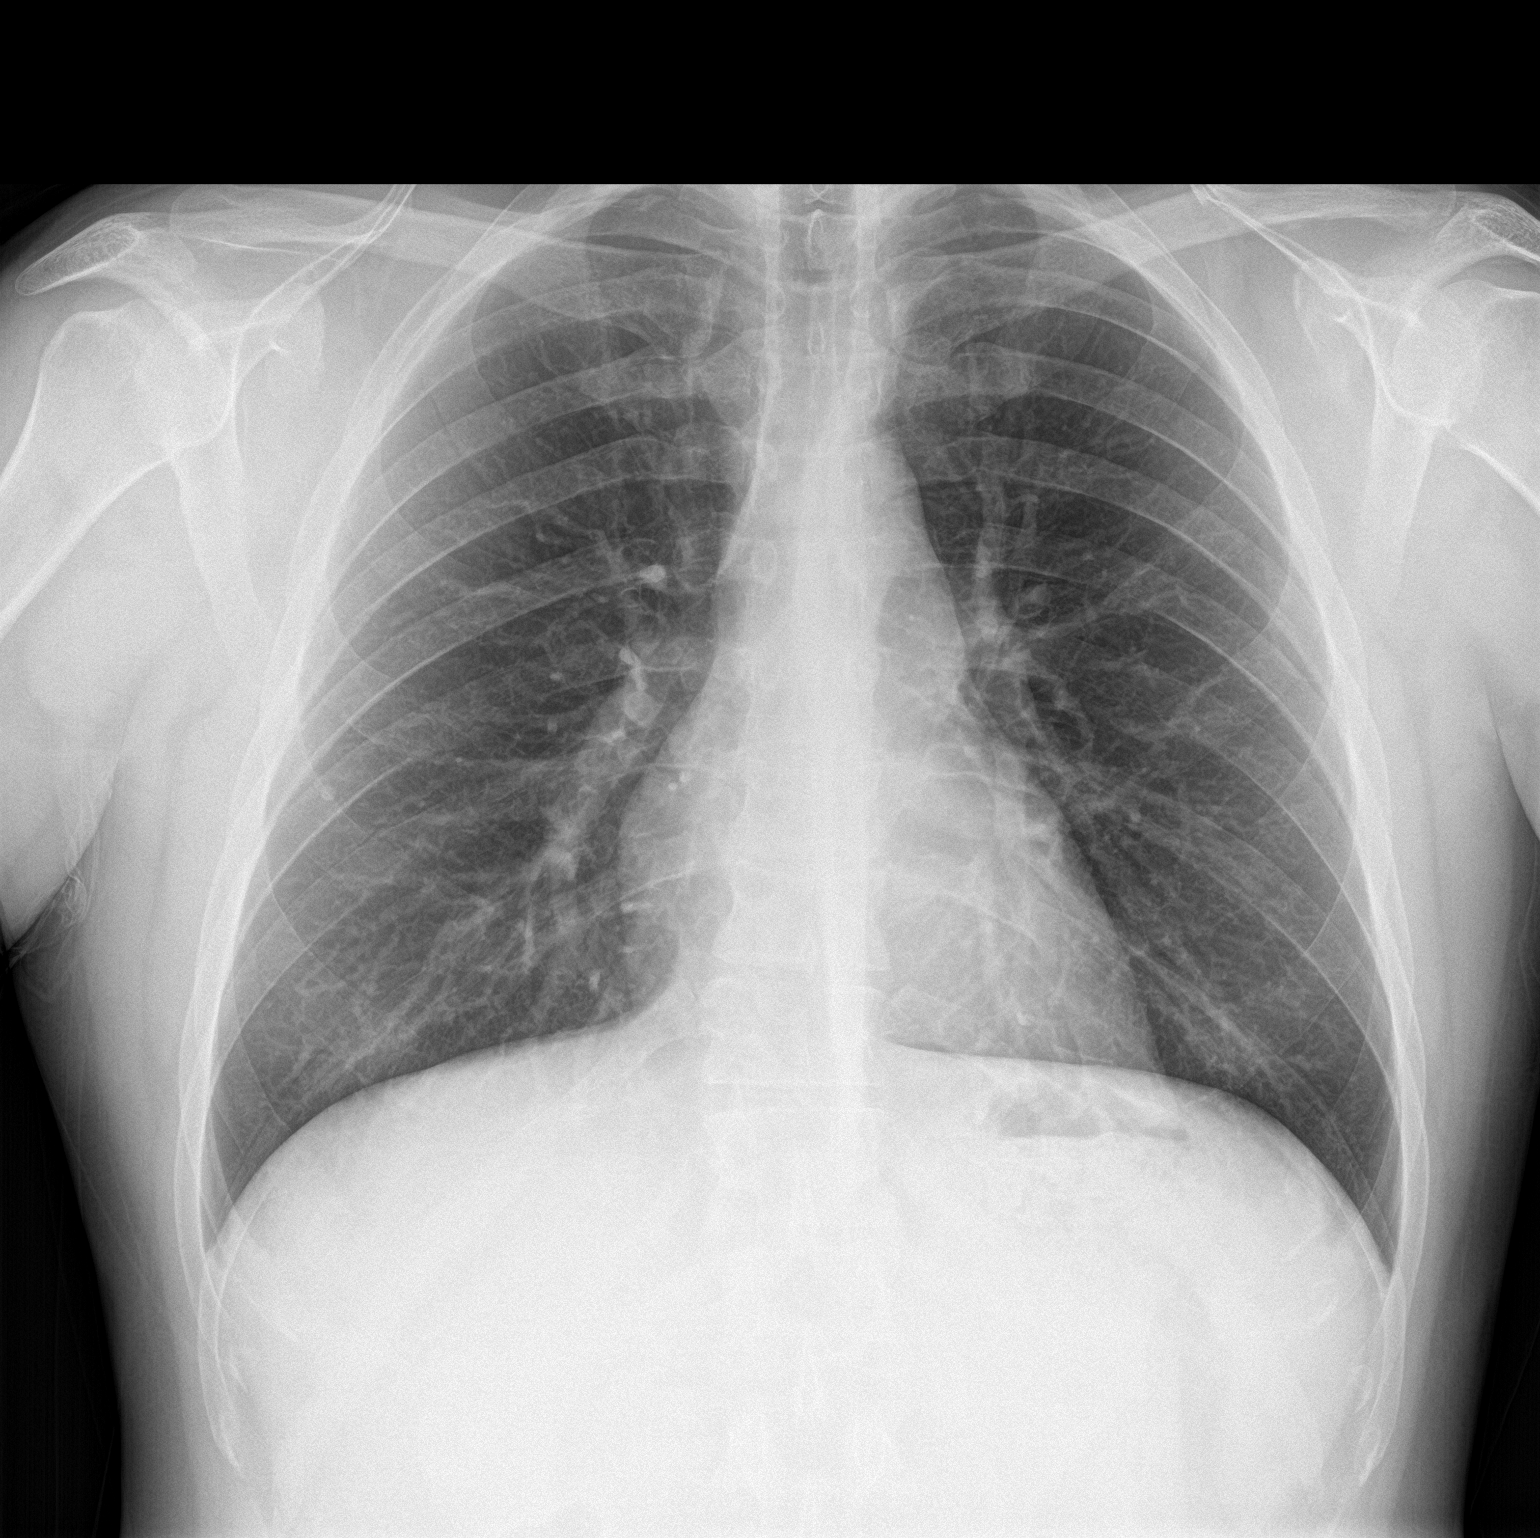

[chest lat]
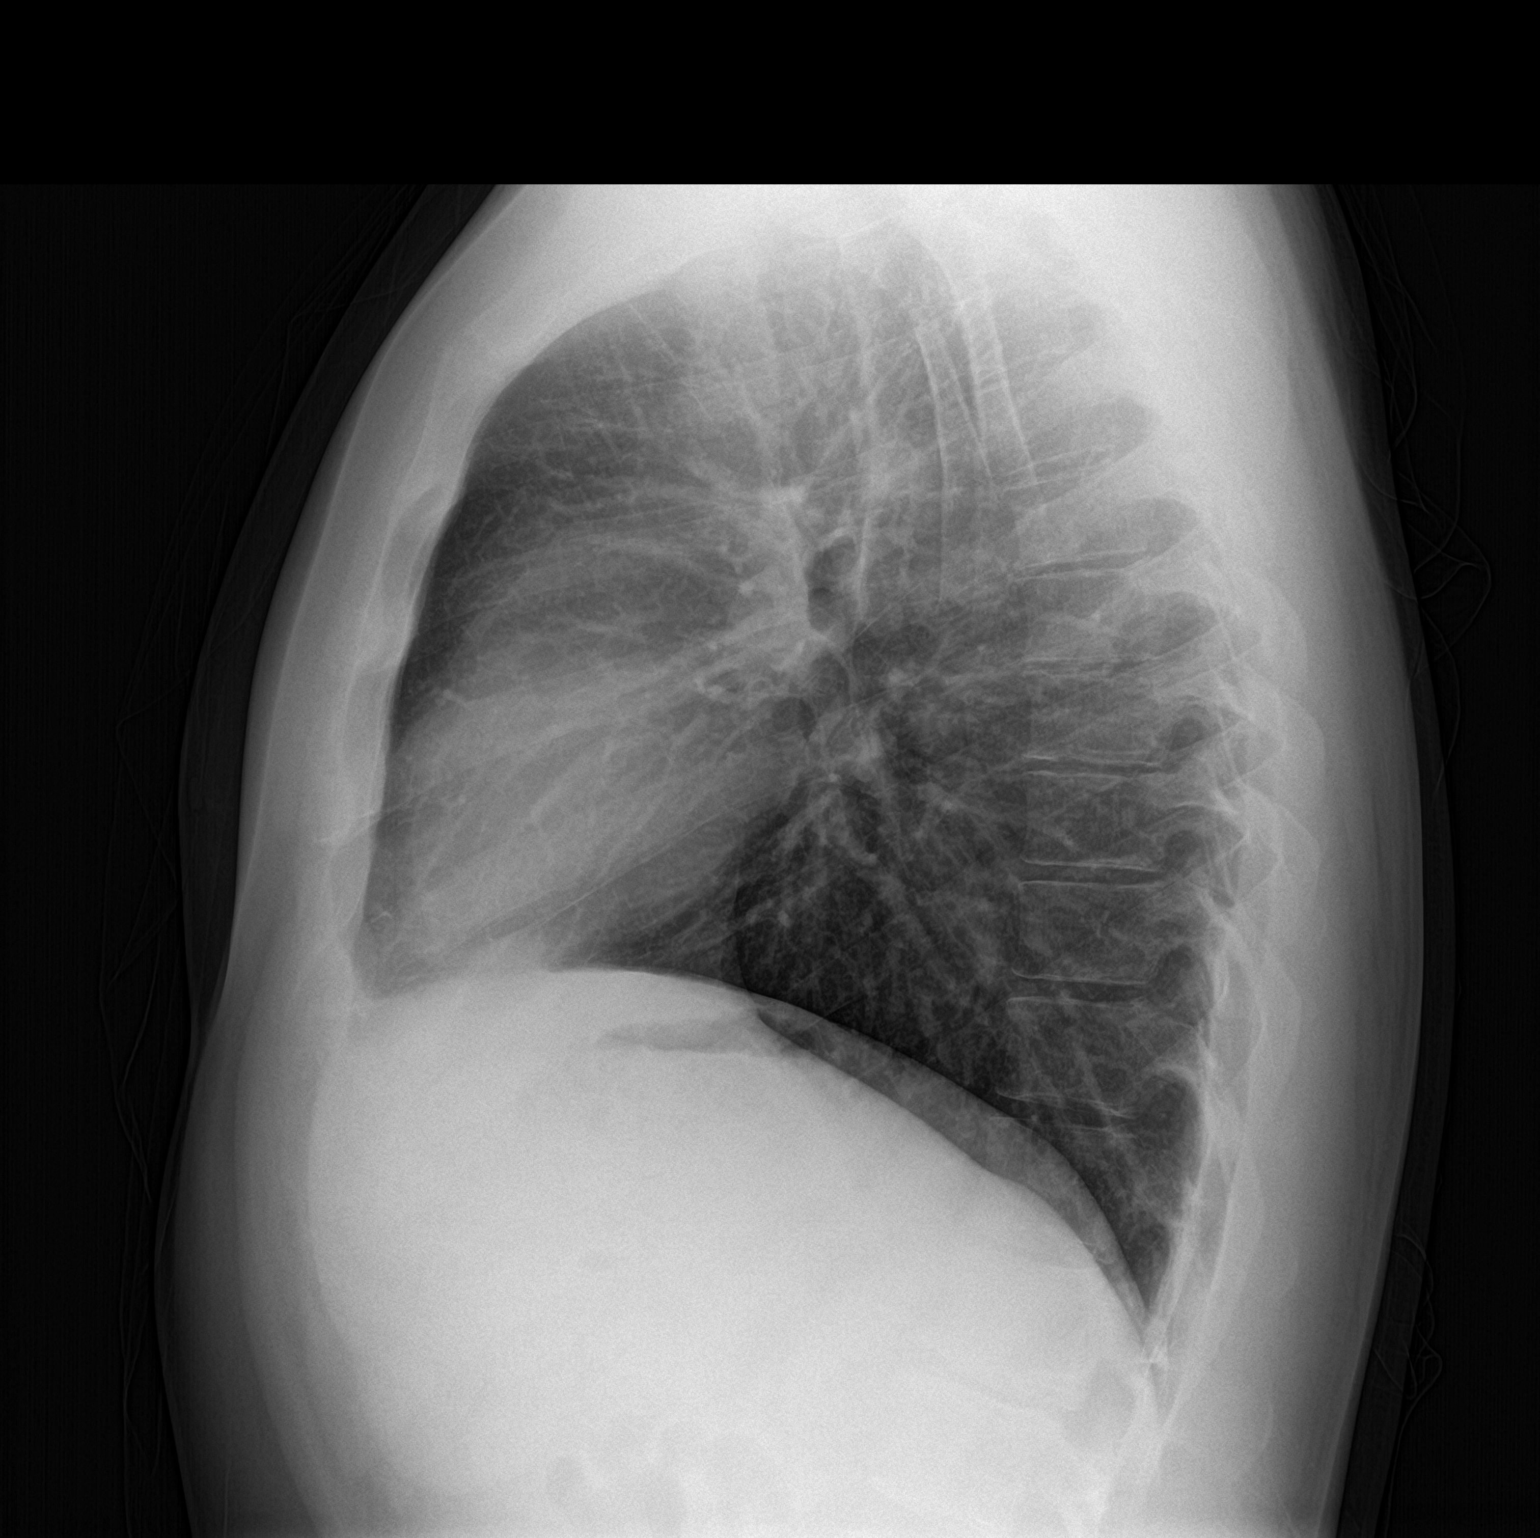

[2 of 2 positions shown; findings below may reference images not displayed]

FINDINGS: Punctate calcified granuloma in the right middle lobe is noted.
Lungs otherwise clear. Heart size normal. No pneumothorax or pleural
effusion. No bony abnormality.
IMPRESSION: Negative chest.

## 2019-07-07 ENCOUNTER — Encounter: Payer: Self-pay | Admitting: Emergency Medicine

## 2019-07-07 ENCOUNTER — Other Ambulatory Visit: Payer: Self-pay

## 2019-07-07 ENCOUNTER — Emergency Department (INDEPENDENT_AMBULATORY_CARE_PROVIDER_SITE_OTHER)
Admission: EM | Admit: 2019-07-07 | Discharge: 2019-07-07 | Disposition: A | Discharge status: Home / Self Care | Payer: BC Managed Care – PPO | Source: Home / Self Care

## 2019-07-07 DIAGNOSIS — J029 Acute pharyngitis, unspecified: Secondary | ICD-10-CM

## 2019-07-07 DIAGNOSIS — R059 Cough, unspecified: Secondary | ICD-10-CM

## 2019-07-07 DIAGNOSIS — R05 Cough: Secondary | ICD-10-CM

## 2019-07-07 DIAGNOSIS — R519 Headache, unspecified: Secondary | ICD-10-CM

## 2019-07-07 DIAGNOSIS — R197 Diarrhea, unspecified: Secondary | ICD-10-CM

## 2019-07-07 NOTE — ED Provider Notes (Signed)
[name]    CSN: [id number] Arrival date & time: 07/07/19  1015     History   Chief Complaint Chief Complaint  Patient presents with  . Sore Throat    HPI Marc Cole is a 33 y.o. male.   HPI  Marc Cole is a 33 y.o. male presenting to UC with c/o 2 days of generalized HA, mild sore throat, dry cough, and diarrhea. He has had 4 episodes of diarrhea over the last 24 hours. No blood or mucous in stool. His work advised him he needed to be tested for possible Covid. Pt works in Estate agent. He also notes his wife's coworker tested positive so his wife needed to be test. Wife tested negative but also is not having any symptoms. Pt denies chest pain, SOB, fever, chills, nausea or vomiting.    Past Medical History:  Diagnosis Date  . Hypertension     There are no active problems to display for this patient.   History reviewed. No pertinent surgical history.     Home Medications    Prior to Admission medications   Not on File    Family History Family History  Problem Relation Age of Onset  . Hypertension Father     Social History Social History   Tobacco Use  . Smoking status: Current Every Day Smoker    Packs/day: 1.00    Types: Cigarettes  . Smokeless tobacco: Current User  Substance Use Topics  . Alcohol use: Yes    Comment: 20 q wk  . Drug use: No     Allergies   Patient has no known allergies.   Review of Systems Review of Systems  Constitutional: Negative for chills and fever.  HENT: Positive for sore throat. Negative for congestion, ear pain, trouble swallowing and voice change.   Respiratory: Positive for cough. Negative for shortness of breath.   Cardiovascular: Negative for chest pain and palpitations.  Gastrointestinal: Positive for diarrhea. Negative for abdominal pain, nausea and vomiting.  Musculoskeletal: Negative for arthralgias, back pain and myalgias.  Skin: Negative for rash.  Neurological: Positive  for headaches. Negative for dizziness and light-headedness.     Physical Exam Triage Vital Signs ED Triage Vitals  Enc Vitals Group     BP 07/07/19 1047 (!) 145/99     Pulse Rate 07/07/19 1047 100     Resp --      Temp 07/07/19 1047 98.3 F (36.8 C)     Temp Source 07/07/19 1047 Oral     SpO2 07/07/19 1047 100 %     Weight 07/07/19 1048 190 lb (86.2 kg)     Height 07/07/19 1048 5\' 7"  (1.702 m)     Head Circumference --      Peak Flow --      Pain Score 07/07/19 1047 3     Pain Loc --      Pain Edu? --      Excl. in [place]? --    No data found.  Updated Vital Signs BP (!) 145/99 (BP Location: Right Arm)   Pulse 100   Temp 98.3 F (36.8 C) (Oral)   Ht 5\' 7"  (1.702 m)   Wt 190 lb (86.2 kg)   SpO2 100%   BMI 29.76 kg/m   Visual Acuity Right Eye Distance:   Left Eye Distance:   Bilateral Distance:    Right Eye Near:   Left Eye Near:    Bilateral Near:     Physical Exam  Vitals signs and nursing note reviewed.  Constitutional:      Appearance: He is well-developed.  HENT:     Head: Normocephalic and atraumatic.     Right Ear: Tympanic membrane normal.     Left Ear: Tympanic membrane normal.     Nose: Nose normal.     Right Sinus: No maxillary sinus tenderness or frontal sinus tenderness.     Left Sinus: No maxillary sinus tenderness or frontal sinus tenderness.     Mouth/Throat:     Lips: Pink.     Mouth: Mucous membranes are moist.     Pharynx: Oropharynx is clear. Uvula midline. Posterior oropharyngeal erythema present. No pharyngeal swelling, oropharyngeal exudate or uvula swelling.  Neck:     Musculoskeletal: Normal range of motion and neck supple.  Cardiovascular:     Rate and Rhythm: Normal rate and regular rhythm.  Pulmonary:     Effort: Pulmonary effort is normal.     Breath sounds: Normal breath sounds.  Abdominal:     General: There is no distension.     Palpations: Abdomen is soft.     Tenderness: There is no abdominal tenderness.   Musculoskeletal: Normal range of motion.  Lymphadenopathy:     Cervical: No cervical adenopathy.  Skin:    General: Skin is warm and dry.  Neurological:     Mental Status: He is alert and oriented to person, place, and time.  Psychiatric:        Behavior: Behavior normal.      UC Treatments / Results  Labs (all labs ordered are listed, but only abnormal results are displayed) Labs Reviewed  NOVEL CORONAVIRUS, NAA    EKG   Radiology No results found.  Procedures Procedures (including critical care time)  Medications Ordered in UC Medications - No data to display  Initial Impression / Assessment and Plan / UC Course  I have reviewed the triage vital signs and the nursing notes.  Pertinent labs & imaging results that were available during my care of the patient were reviewed by me and considered in my medical decision making (see chart for details).    Pt tested for covid Benign abdominal exam. Encouraged symptomatic treatment including good hydration and rest. AVS and work note provided.  Final Clinical Impressions(s) / UC Diagnoses   Final diagnoses:  Acute pharyngitis, unspecified etiology  Generalized headache  Diarrhea, unspecified type  Cough     Discharge Instructions      You may take 500mg  acetaminophen every 4-6 hours or in combination with ibuprofen 400-600mg  every 6-8 hours as needed for pain, inflammation, and fever.  Be sure to well hydrated with clear liquids and get at least 8 hours of sleep at night, preferably more while sick.   Please follow up with family medicine in 1 week if needed.  Due to concern for possibly having Covid-19, it is advised that you self-isolate at home until test results come back.  If positive, it is recommended you stay isolated for at least 10 days after symptom onset and 24 hours after last fever without taking medication (whichever is longer).  If you MUST go out, please wear a mask at all times, limit contact  with others.      ED Prescriptions    None     Controlled Substance Prescriptions [place] Controlled Substance Registry consulted? Not Applicable   Lurene Shadowhelps, Lethia Donlon O, PA-C 07/07/19 1200

## 2019-07-07 NOTE — ED Triage Notes (Signed)
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

## 2019-07-07 NOTE — Discharge Instructions (Signed)
°  You may take 500mg acetaminophen every 4-6 hours or in combination with ibuprofen 400-600mg every 6-8 hours as needed for pain, inflammation, and fever. ° °Be sure to well hydrated with clear liquids and get at least 8 hours of sleep at night, preferably more while sick.  ° °Please follow up with family medicine in 1 week if needed. ° ° °Due to concern for possibly having Covid-19, it is advised that you self-isolate at home until test results come back.  If positive, it is recommended you stay isolated for at least 10 days after symptom onset and 24 hours after last fever without taking medication (whichever is longer).  If you MUST go out, please wear a mask at all times, limit contact with others.  ° °

## 2019-07-08 ENCOUNTER — Telehealth: Payer: Self-pay

## 2019-07-08 LAB — NOVEL CORONAVIRUS, NAA: SARS-CoV-2, NAA: NOT DETECTED

## 2019-07-08 NOTE — Telephone Encounter (Signed)
Notified pt negative COVID

## 2020-12-10 ENCOUNTER — Other Ambulatory Visit: Payer: Self-pay

## 2020-12-10 ENCOUNTER — Emergency Department (INDEPENDENT_AMBULATORY_CARE_PROVIDER_SITE_OTHER)
Admission: RE | Admit: 2020-12-10 | Discharge: 2020-12-10 | Disposition: A | Discharge status: Home / Self Care | Payer: BC Managed Care – PPO | Source: Ambulatory Visit

## 2020-12-10 VITALS — BP 135/84 | HR 123 | Temp 99.3°F

## 2020-12-10 DIAGNOSIS — Z20822 Contact with and (suspected) exposure to covid-19: Secondary | ICD-10-CM

## 2020-12-10 DIAGNOSIS — U071 COVID-19: Secondary | ICD-10-CM

## 2020-12-10 LAB — POC SARS CORONAVIRUS 2 AG -  ED: SARS Coronavirus 2 Ag: POSITIVE — AB

## 2020-12-10 NOTE — ED Provider Notes (Signed)
[name]    CSN: [id number] Arrival date & time: 12/10/20  1250      History   Chief Complaint Chief Complaint  Patient presents with  . Generalized Body Aches  . Appointment    HPI Marc Cole is a 35 y.o. male.   HPI  Marc Cole is a 35 y.o. male presenting to UC with c/o body aches, lo grade fever, mildly productive cough and generalized HA for 2 days. His wife tested positive for COVID the day before his symptoms started. Pt has been taking Tylenol, ibuprofen, nyquil, vitamin c, zinc, and vintamin D. Denies chest pain or SOB. No n/v/d. Pt also here with his son with similar symptoms. Pt and son not vaccinated. Son's rapid COVID test Positive today.    Past Medical History:  Diagnosis Date  . Hypertension     There are no problems to display for this patient.   History reviewed. No pertinent surgical history.     Home Medications    Prior to Admission medications   Medication Sig Start Date End Date Taking? Authorizing Provider  acetaminophen (TYLENOL) 325 MG tablet Take 650 mg by mouth every 6 (six) hours as needed.    [provider]  cholecalciferol (VITAMIN D3) 25 MCG (1000 UNIT) tablet Take 1,000 Units by mouth daily.    [provider]  nirmatrelvir/ritonavir EUA (PAXLOVID) TABS Take 3 tablets by mouth 2 (two) times daily for 5 days. Take nirmatrelvir (150 mg) 2 tablet(s) twice daily for 5 days and ritonavir (100 mg) one tablet twice daily for 5 days. 12/11/20 12/16/20  Loa Socks, NP  vitamin C (ASCORBIC ACID) 500 MG tablet Take 1,000 mg by mouth daily.    [provider]  zinc gluconate 50 MG tablet Take 50 mg by mouth daily.    [provider]    Family History Family History  Problem Relation Age of Onset  . Hypertension Father     Social History Social History   Tobacco Use  . Smoking status: Current Every Day Smoker    Packs/day: 1.00    Types: Cigarettes  . Smokeless  tobacco: Current User  Vaping Use  . Vaping Use: Never used  Substance Use Topics  . Alcohol use: Yes    Comment: 20 q wk  . Drug use: No     Allergies   Patient has no known allergies.   Review of Systems Review of Systems  Constitutional: Positive for fever. Negative for chills.  HENT: Positive for congestion. Negative for ear pain, sore throat, trouble swallowing and voice change.   Respiratory: Positive for cough. Negative for shortness of breath.   Cardiovascular: Negative for chest pain and palpitations.  Gastrointestinal: Negative for abdominal pain, diarrhea, nausea and vomiting.  Musculoskeletal: Positive for arthralgias and myalgias. Negative for back pain.  Skin: Negative for rash.  Neurological: Positive for headaches. Negative for dizziness and light-headedness.  All other systems reviewed and are negative.    Physical Exam Triage Vital Signs ED Triage Vitals  Enc Vitals Group     BP 12/10/20 1310 135/84     Pulse Rate 12/10/20 1310 (!) 123     Resp --      Temp 12/10/20 1310 99.3 F (37.4 C)     Temp Source 12/10/20 1310 Oral     SpO2 12/10/20 1310 98 %     Weight --      Height --      Head Circumference --  Peak Flow --      Pain Score 12/10/20 1311 6     Pain Loc --      Pain Edu? --      Excl. in [place]? --    No data found.  Updated Vital Signs BP 135/84 (BP Location: Right Arm)   Pulse (!) 123   Temp 99.3 F (37.4 C) (Oral)   SpO2 98%   Visual Acuity Right Eye Distance:   Left Eye Distance:   Bilateral Distance:    Right Eye Near:   Left Eye Near:    Bilateral Near:     Physical Exam Vitals and nursing note reviewed.  Constitutional:      General: He is not in acute distress.    Appearance: Normal appearance. He is well-developed and well-nourished. He is not ill-appearing, toxic-appearing or diaphoretic.  HENT:     Head: Normocephalic and atraumatic.     Right Ear: Tympanic membrane and ear canal normal.     Left Ear:  Tympanic membrane and ear canal normal.     Nose: Nose normal.     Right Sinus: No maxillary sinus tenderness or frontal sinus tenderness.     Left Sinus: No maxillary sinus tenderness or frontal sinus tenderness.     Mouth/Throat:     Lips: Pink.     Mouth: Mucous membranes are moist.     Pharynx: Oropharynx is clear. Uvula midline. No pharyngeal swelling, oropharyngeal exudate, posterior oropharyngeal erythema or uvula swelling.  Eyes:     Extraocular Movements: EOM normal.  Cardiovascular:     Rate and Rhythm: Regular rhythm. Tachycardia present.  Pulmonary:     Effort: Pulmonary effort is normal. No respiratory distress.     Breath sounds: Normal breath sounds. No stridor. No wheezing, rhonchi or rales.  Musculoskeletal:        General: Normal range of motion.     Cervical back: Normal range of motion and neck supple. No tenderness.  Lymphadenopathy:     Cervical: No cervical adenopathy.  Skin:    General: Skin is warm and dry.  Neurological:     Mental Status: He is alert and oriented to person, place, and time.  Psychiatric:        Mood and Affect: Mood and affect normal.        Behavior: Behavior normal.      UC Treatments / Results  Labs (all labs ordered are listed, but only abnormal results are displayed) Labs Reviewed  POC SARS CORONAVIRUS 2 AG -  ED - Abnormal; Notable for the following components:      Result Value   SARS Coronavirus 2 Ag Positive (*)    All other components within normal limits    EKG   Radiology No results found.  Procedures Procedures (including critical care time)  Medications Ordered in UC Medications - No data to display  Initial Impression / Assessment and Plan / UC Course  I have reviewed the triage vital signs and the nursing notes.  Pertinent labs & imaging results that were available during my care of the patient were reviewed by me and considered in my medical decision making (see chart for details).     Rapid  COVID: Positive No evidence of bacterial infection at this time.  Encouraged symptomatic tx F/u with PCP as needed AVS and work note given  Final Clinical Impressions(s) / UC Diagnoses   Final diagnoses:  Exposure to COVID-19 virus  COVID     Discharge  Instructions     CDC recommends isolation for 5 days from symptom onset and fever free for 24 hours without use of fever reducing medication and symptoms improving. Then wear a well fitting mask for 5 days when around others. If not improving, isolate for 10 days and consider reevaluation by a medical provider to rule out a secondary bacterial infection.   Call 911 or have someone drive you to the hospital if symptoms significantly worsening.  You may take 500mg  acetaminophen every 4-6 hours or in combination with ibuprofen 400-600mg  every 6-8 hours as needed for pain, inflammation, and fever.  Be sure to well hydrated with clear liquids and get at least 8 hours of sleep at night, preferably more while sick.   Please follow up with family medicine in 1 week if needed.     ED Prescriptions    None     PDMP not reviewed this encounter.   , Lurene Shadow 12/13/20 [phone]

## 2020-12-10 NOTE — ED Triage Notes (Signed)
Patient's wife tested positive for COVID on Thursday.  Patient tested negative on Thursday.  Started having sxs Friday night, body aches, fever, cough and headache.  Patient has taken Tylenol, Ibuprofen, Nyquil, Vitamin C, Zinc, Vitamin D3.  Patient is not vaccinated.

## 2020-12-11 ENCOUNTER — Ambulatory Visit (HOSPITAL_COMMUNITY)
Admission: RE | Admit: 2020-12-11 | Discharge: 2020-12-11 | Disposition: A | Discharge status: Home / Self Care | Payer: BC Managed Care – PPO | Source: Ambulatory Visit | Attending: Pulmonary Disease | Admitting: Pulmonary Disease

## 2020-12-11 ENCOUNTER — Other Ambulatory Visit (HOSPITAL_COMMUNITY): Payer: Self-pay | Admitting: Adult Health

## 2020-12-11 DIAGNOSIS — U071 COVID-19: Secondary | ICD-10-CM | POA: Diagnosis not present

## 2020-12-11 LAB — BASIC METABOLIC PANEL
Anion gap: 11 (ref 5–15)
BUN: 9 mg/dL (ref 6–20)
CO2: 28 mmol/L (ref 22–32)
Calcium: 8.5 mg/dL — ABNORMAL LOW (ref 8.9–10.3)
Chloride: 101 mmol/L (ref 98–111)
Creatinine, Ser: 0.93 mg/dL (ref 0.61–1.24)
GFR, Estimated: >60 mL/min (ref >60)
Glucose, Bld: 89 mg/dL (ref 70–99)
Potassium: 3.6 mmol/L (ref 3.5–5.1)
Sodium: 140 mmol/L (ref 135–145)

## 2020-12-11 MED ORDER — NIRMATRELVIR/RITONAVIR (PAXLOVID)TABLET: 3.0000 | ORAL_TABLET | Freq: Two times a day (BID) | ORAL | 0 refills | Status: AC

## 2020-12-11 MED FILL — PAXLOVID 20 X 150 MG & 10 X: 20 X 150 MG | 30 days supply | Qty: 30 | Fill #0

## 2020-12-11 NOTE — Progress Notes (Signed)
Outpatient Oral COVID Treatment Note  I connected with Marc Cole on 12/11/2020/4:33 PM by telephone and verified that I am speaking with the correct person using two identifiers.  I discussed the limitations, risks, security, and privacy concerns of performing an evaluation and management service by telephone and the availability of in person appointments. I also discussed with the patient that there may be a patient responsible charge related to this service. The patient expressed understanding and agreed to proceed.  Patient location: home Provider location: home  Diagnosis: COVID-19 infection  Purpose of visit: Discussion of potential use of Molnupiravir or Paxlovid, a new treatment for mild to moderate COVID-19 viral infection in non-hospitalized patients.   Subjective: Patient is a 35 y.o. male who has been diagnosed with COVID 19 viral infection.  Their symptoms began on nasal drainage, fatigue and cough beginning 12/08/2020.    Past Medical History:  Diagnosis Date  . Hypertension     No Known Allergies   Current Outpatient Medications:  .  cholecalciferol (VITAMIN D3) 25 MCG (1000 UNIT) tablet, Take 1,000 Units by mouth daily., Disp: , Rfl:  .  vitamin C (ASCORBIC ACID) 500 MG tablet, Take 1,000 mg by mouth daily., Disp: , Rfl:  .  zinc gluconate 50 MG tablet, Take 50 mg by mouth daily., Disp: , Rfl:   Objective: Patient sounds tired, and sick but in no acute distress.  Breathing is non labored.  Mood and behavior are normal.  Laboratory Data:  Recent Results (from the past 2160 hour(s))  POC SARS Coronavirus 2 Ag-ED - Nasal Swab     Status: Abnormal   Collection Time: 12/10/20  1:30 PM  Result Value Ref Range   SARS Coronavirus 2 Ag Positive (A) Negative     Assessment: 35 y.o. male with mild/moderate COVID 19 viral infection diagnosed on 12/09/2020 at high risk for progression to severe COVID 19.  Plan:  This patient is a 35 y.o. male that meets the following  criteria for Emergency Use Authorization of: Paxlovid 1. Age >12 yr AND > 40 kg 2. SARS-COV-2 positive test 3. Symptom onset < 5 days 4. Mild-to-moderate COVID disease with high risk for severe progression to hospitalization or death  I have spoken and communicated the following to the patient or parent/caregiver regarding: 1. Paxlovid is an unapproved drug that is authorized for use under an Emergency Use Authorization.  2. There are no adequate, approved, available products for the treatment of COVID-19 in adults who have mild-to-moderate COVID-19 and are at high risk for progressing to severe COVID-19, including hospitalization or death. 3. Other therapeutics are currently authorized. For additional information on all products authorized for treatment or prevention of COVID-19, please see [email or web address]-[email or web address]  4. There are benefits and risks of taking this treatment as outlined in the "Fact Sheet for Patients and Caregivers."  5. "Fact Sheet for Patients and Caregivers" was reviewed with patient. A hard copy will be provided to patient from pharmacy prior to the patient receiving treatment. 6. Patients should continue to self-isolate and use infection control measures (e.g., wear mask, isolate, social distance, avoid sharing personal items, clean and disinfect "high touch" surfaces, and frequent handwashing) according to CDC guidelines.  7. The patient or parent/caregiver has the option to accept or refuse treatment. 8. Patient medication history was reviewed for potential drug interactions:No drug interactions 9. Patient's creatinine clearance was calculated to be 136, and they were therefore prescribed Normal dose (CrCl>60) - nirmatrelvir 150mg  tab (2 tablet) by mouth twice  daily AND ritonavir 100mg  tab (1 tablet) by mouth twice daily   After reviewing above information with the patient, the  patient agrees to receive Paxlovid.  Follow up instructions:    . Take prescription BID x 5 days as directed . Reach out to pharmacist for counseling on medication if desired . For concerns regarding further COVID symptoms please follow up with your PCP or urgent care . For urgent or life-threatening issues, seek care at your local emergency department  The patient was provided an opportunity to ask questions, and all were answered. The patient agreed with the plan and demonstrated an understanding of the instructions.   Script sent to [facility] and opted to pick up RX.  The patient was advised to call their PCP or seek an in-person evaluation if the symptoms worsen or if the condition fails to improve as anticipated.   I provided 20 minutes of non face-to-face telephone visit time during this encounter, and > 50% was spent counseling as documented under my assessment & plan.  [facility], NP 12/11/2020 /4:33 PM

## 2020-12-12 ENCOUNTER — Telehealth (HOSPITAL_COMMUNITY): Payer: Self-pay | Admitting: Pharmacist

## 2020-12-12 NOTE — Telephone Encounter (Signed)
Patient was prescribed oral covid treatment paxlovid and treatment note was reviewed. Medication has been received by [facility] and reviewed for appropriateness.  Drug Interactions or Dosage Adjustments Noted: no drug interactions or dosage adjustment necessary   Delivery Method: Pickup   Patient contacted for counseling on 12/11/20 and verbalized understanding.   Delivery or Pick-Up Date: 12/11/2020   Rita Ohara 12/12/2020, 9:50 AM [facility] Outpatient Pharmacist Phone# [phone]

## 2021-05-06 ENCOUNTER — Emergency Department (INDEPENDENT_AMBULATORY_CARE_PROVIDER_SITE_OTHER)
Admission: RE | Admit: 2021-05-06 | Discharge: 2021-05-06 | Disposition: A | Discharge status: Home / Self Care | Payer: BC Managed Care – PPO | Source: Ambulatory Visit

## 2021-05-06 ENCOUNTER — Other Ambulatory Visit: Payer: Self-pay

## 2021-05-06 VITALS — BP 113/76 | HR 85

## 2021-05-06 DIAGNOSIS — L237 Allergic contact dermatitis due to plants, except food: Secondary | ICD-10-CM

## 2021-05-06 DIAGNOSIS — L03115 Cellulitis of right lower limb: Secondary | ICD-10-CM | POA: Diagnosis not present

## 2021-05-06 MED ORDER — DEXAMETHASONE SODIUM PHOSPHATE 10 MG/ML IJ SOLN
10.0000 mg | Freq: Once | INTRAMUSCULAR | Status: AC
  Administered 2021-05-06: 14:00:00 10 mg via INTRAMUSCULAR

## 2021-05-06 MED ORDER — AMOXICILLIN-POT CLAVULANATE 875-125 MG PO TABS: 1.0000 | ORAL_TABLET | Freq: Two times a day (BID) | ORAL | 0 refills | Status: AC

## 2021-05-06 MED ORDER — TRIAMCINOLONE ACETONIDE 0.1 % EX CREA: 1.0000 "application " | TOPICAL_CREAM | Freq: Two times a day (BID) | CUTANEOUS | 0 refills | Status: DC

## 2021-05-06 NOTE — ED Provider Notes (Signed)
[facility]    CSN: [id number] Arrival date & time: 05/06/21  1256      History   Chief Complaint Chief Complaint  Patient presents with   Appointment    Poison Ivy    HPI Marc Cole is a 35 y.o. male.   Reports that he was exposed to poison ivy about 4 days ago. Reports red, itchy, weeping rash to bilateral lower extremities. Also concerned about infection at the medial right knee where he has an area of increased erythema, warmth, swelling, tenderness with drainage. Has not attempted OTC treatment. Reports previous symptoms in the past. Denies red streaking from the area, chills, fever, nausea, vomiting, diarrhea, other symptoms.  ROS per HPI  The history is provided by the patient.   Past Medical History:  Diagnosis Date   Hypertension     There are no problems to display for this patient.   History reviewed. No pertinent surgical history.     Home Medications    Prior to Admission medications   Medication Sig Start Date End Date Taking? Authorizing Provider  acetaminophen (TYLENOL) 325 MG tablet Take 650 mg by mouth every 6 (six) hours as needed.   Yes [provider]  amoxicillin-clavulanate (AUGMENTIN) 875-125 MG tablet Take 1 tablet by mouth 2 (two) times daily for 7 days. 05/06/21 05/13/21 Yes Moshe Cipro, NP  cholecalciferol (VITAMIN D3) 25 MCG (1000 UNIT) tablet Take 1,000 Units by mouth daily.   Yes [provider]  triamcinolone cream (KENALOG) 0.1 % Apply 1 application topically 2 (two) times daily. 05/06/21  Yes Moshe Cipro, NP  Nirmatrelvir & Ritonavir 20 x 150 MG & 10 x 100MG  TBPK TAKE 3 TABLETS BY MOUTH 2 (TWO) TIMES DAILY FOR 5 DAYS. 12/11/20 12/11/21  12/13/21, NP  vitamin C (ASCORBIC ACID) 500 MG tablet Take 1,000 mg by mouth daily.    [provider]  zinc gluconate 50 MG tablet Take 50 mg by mouth daily.    [provider]    Family History Family History  Problem  Relation Age of Onset   Hypertension Father     Social History Social History   Tobacco Use   Smoking status: Every Day    Packs/day: 1.00    Pack years: 0.00    Types: Cigarettes   Smokeless tobacco: Current  Vaping Use   Vaping Use: Never used  Substance Use Topics   Alcohol use: Yes    Comment: 20 q wk   Drug use: No     Allergies   Patient has no known allergies.   Review of Systems Review of Systems   Physical Exam Triage Vital Signs ED Triage Vitals  Enc Vitals Group     BP 05/06/21 1314 113/76     Pulse Rate 05/06/21 1314 85     Resp --      Temp --      Temp Source 05/06/21 1314 Oral     SpO2 05/06/21 1314 99 %     Weight --      Height --      Head Circumference --      Peak Flow --      Pain Score 05/06/21 1317 0     Pain Loc --      Pain Edu? --      Excl. in GC? --    No data found.  Updated Vital Signs BP 113/76 (BP Location: Left Arm)   Pulse 85  SpO2 99%   Visual Acuity Right Eye Distance:   Left Eye Distance:   Bilateral Distance:    Right Eye Near:   Left Eye Near:    Bilateral Near:     Physical Exam Vitals and nursing note reviewed.  Constitutional:      Appearance: Normal appearance. He is well-developed.  HENT:     Head: Normocephalic and atraumatic.     Nose: Nose normal.     Mouth/Throat:     Mouth: Mucous membranes are moist.     Pharynx: Oropharynx is clear.  Eyes:     Extraocular Movements: Extraocular movements intact.     Conjunctiva/sclera: Conjunctivae normal.     Pupils: Pupils are equal, round, and reactive to light.  Cardiovascular:     Rate and Rhythm: Normal rate and regular rhythm.  Pulmonary:     Effort: Pulmonary effort is normal. No respiratory distress.  Musculoskeletal:        General: Normal range of motion.     Cervical back: Normal range of motion and neck supple.  Skin:    General: Skin is warm and dry.     Capillary Refill: Capillary refill takes less than 2 seconds.     Findings:  Rash present.     Comments: Scattered erythematous vesicular rash with some serous fluid weeping to bilateral lower extremities  Medial right knee with 5x5 cm area of erythema, swelling, tenderness, warmth, with honey colored crust draining from central lesion, consistent with cellulitis  Neurological:     General: No focal deficit present.     Mental Status: He is alert and oriented to person, place, and time.  Psychiatric:        Mood and Affect: Mood normal.        Behavior: Behavior normal.        Thought Content: Thought content normal.     UC Treatments / Results  Labs (all labs ordered are listed, but only abnormal results are displayed) Labs Reviewed - No data to display  EKG   Radiology No results found.  Procedures Procedures (including critical care time)  Medications Ordered in UC Medications  dexamethasone (DECADRON) injection 10 mg (10 mg Intramuscular Given 05/06/21 1340)    Initial Impression / Assessment and Plan / UC Course  I have reviewed the triage vital signs and the nursing notes.  Pertinent labs & imaging results that were available during my care of the patient were reviewed by me and considered in my medical decision making (see chart for details).    Contact Dermatitis Cellulitis   Decadron 10mg  IM in office today Triamcinolone cream BID prn itching prescribed Augmentin 875mg  BID x 7 days prescribed Follow up with this office or with primary care if symptoms are persisting.  Follow up in the ER for high fever, trouble swallowing, trouble breathing, other concerning symptoms.   Final Clinical Impressions(s) / UC Diagnoses   Final diagnoses:  Allergic contact dermatitis due to plants, except food  Cellulitis of leg, right     Discharge Instructions      You have received a steroid injection in the office today  I have sent in Augmentin for you to take twice a day for 7 days.  I have sent in triamcinolone cream for you to use to  the area twice a day as needed.  Follow up with this office or with primary care if symptoms are persisting.  Follow up in the ER for high fever, trouble swallowing, trouble  breathing, other concerning symptoms.       ED Prescriptions     Medication Sig Dispense Auth. Provider   triamcinolone cream (KENALOG) 0.1 % Apply 1 application topically 2 (two) times daily. 30 g Moshe Cipro, NP   amoxicillin-clavulanate (AUGMENTIN) 875-125 MG tablet Take 1 tablet by mouth 2 (two) times daily for 7 days. 14 tablet Moshe Cipro, NP      PDMP not reviewed this encounter.   Moshe Cipro, NP 05/06/21 1350

## 2021-05-06 NOTE — ED Triage Notes (Signed)
Patient c/o that his poison ivy on his legs has gotten infected.  Patient has had the poison ivy x 3 days, area is red and very itchy.  Patient denies any OTC meds.

## 2021-05-06 NOTE — Discharge Instructions (Addendum)
You have received a steroid injection in the office today  I have sent in Augmentin for you to take twice a day for 7 days.  I have sent in triamcinolone cream for you to use to the area twice a day as needed.  Follow up with this office or with primary care if symptoms are persisting.  Follow up in the ER for high fever, trouble swallowing, trouble breathing, other concerning symptoms.

## 2021-06-24 ENCOUNTER — Other Ambulatory Visit: Payer: Self-pay

## 2021-06-24 ENCOUNTER — Emergency Department (INDEPENDENT_AMBULATORY_CARE_PROVIDER_SITE_OTHER)
Admission: RE | Admit: 2021-06-24 | Discharge: 2021-06-24 | Disposition: A | Discharge status: Home / Self Care | Payer: BC Managed Care – PPO | Source: Ambulatory Visit

## 2021-06-24 VITALS — BP 113/78 | HR 88 | Temp 98.5°F | Resp 15

## 2021-06-24 DIAGNOSIS — T162XXA Foreign body in left ear, initial encounter: Secondary | ICD-10-CM

## 2021-06-24 NOTE — Discharge Instructions (Addendum)
Return as needed

## 2021-06-24 NOTE — ED Provider Notes (Signed)
[facility]    CSN: [id number] Arrival date & time: 06/24/21  1356      History   Chief Complaint Chief Complaint  Patient presents with   Foreign Body in Ear    Left     HPI Marc Cole is a 35 y.o. male.   HPI  Patient states that last night at work a bug flew into his ear.  He states that he still feels some fullness today.  He is here to make sure there are no blood parts left in the left ear.  He states that he is not having any pain.  No discharge or drainage.  He states his ear feels "full" but hearing is normal  Past Medical History:  Diagnosis Date   Hypertension     There are no problems to display for this patient.   History reviewed. No pertinent surgical history.     Home Medications    Prior to Admission medications   Medication Sig Start Date End Date Taking? Authorizing Provider  acetaminophen (TYLENOL) 325 MG tablet Take 650 mg by mouth every 6 (six) hours as needed.    [provider]  cholecalciferol (VITAMIN D3) 25 MCG (1000 UNIT) tablet Take 1,000 Units by mouth daily.    [provider]  Nirmatrelvir & Ritonavir 20 x 150 MG & 10 x 100MG  TBPK TAKE 3 TABLETS BY MOUTH 2 (TWO) TIMES DAILY FOR 5 DAYS. Patient not taking: Reported on 06/24/2021 12/11/20 12/11/21  12/13/21, NP  triamcinolone cream (KENALOG) 0.1 % Apply 1 application topically 2 (two) times daily. 05/06/21   05/08/21, NP  vitamin C (ASCORBIC ACID) 500 MG tablet Take 1,000 mg by mouth daily.    [provider]  zinc gluconate 50 MG tablet Take 50 mg by mouth daily.    [provider]    Family History Family History  Problem Relation Age of Onset   Hypertension Father     Social History Social History   Tobacco Use   Smoking status: Every Day    Packs/day: 1.00    Types: Cigarettes   Smokeless tobacco: Current  Vaping Use   Vaping Use: Never used  Substance Use Topics   Alcohol use: Yes    Comment:  20 q wk   Drug use: No     Allergies   Trazodone and nefazodone   Review of Systems Review of Systems See HPI  Physical Exam Triage Vital Signs ED Triage Vitals  Enc Vitals Group     BP 06/24/21 1458 113/78     Pulse Rate 06/24/21 1458 88     Resp 06/24/21 1458 15     Temp 06/24/21 1458 98.5 F (36.9 C)     Temp Source 06/24/21 1458 Oral     SpO2 06/24/21 1458 98 %     Weight --      Height --      Head Circumference --      Peak Flow --      Pain Score 06/24/21 1501 3     Pain Loc --      Pain Edu? --      Excl. in GC? --    No data found.  Updated Vital Signs BP 113/78 (BP Location: Left Arm)   Pulse 88   Temp 98.5 F (36.9 C) (Oral)   Resp 15   SpO2 98%      Physical Exam Constitutional:      General: He  is not in acute distress.    Appearance: He is well-developed and normal weight.  HENT:     Head: Normocephalic and atraumatic.     Right Ear: Tympanic membrane, ear canal and external ear normal.     Left Ear: Tympanic membrane, ear canal and external ear normal.     Ears:     Comments: I looked in the left ear canal and do not see any insect.  There is a slight curve in the canal so I cannot see the entire tympanic membrane, inferior surface is blocked.  I did order the nurse to rinse his ear and she did get out a small insect (gnat) Eyes:     Conjunctiva/sclera: Conjunctivae normal.     Pupils: Pupils are equal, round, and reactive to light.  Cardiovascular:     Rate and Rhythm: Normal rate.  Pulmonary:     Effort: Pulmonary effort is normal. No respiratory distress.  Abdominal:     General: There is no distension.     Palpations: Abdomen is soft.  Musculoskeletal:        General: Normal range of motion.     Cervical back: Normal range of motion.  Skin:    General: Skin is warm and dry.  Neurological:     Mental Status: He is alert.  Psychiatric:        Mood and Affect: Mood normal.        Behavior: Behavior normal.     UC Treatments  / Results  Labs (all labs ordered are listed, but only abnormal results are displayed) Labs Reviewed - No data to display  EKG   Radiology No results found.  Procedures Procedures (including critical care time)  Medications Ordered in UC Medications - No data to display  Initial Impression / Assessment and Plan / UC Course  I have reviewed the triage vital signs and the nursing notes.  Pertinent labs & imaging results that were available during my care of the patient were reviewed by me and considered in my medical decision making (see chart for details).      Final Clinical Impressions(s) / UC Diagnoses   Final diagnoses:  Foreign body of left ear, initial encounter     Discharge Instructions      Return as needed     ED Prescriptions   None    PDMP not reviewed this encounter.   Eustace Moore, MD 06/24/21 [phone]

## 2021-06-24 NOTE — ED Triage Notes (Signed)
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

## 2021-08-16 ENCOUNTER — Emergency Department (INDEPENDENT_AMBULATORY_CARE_PROVIDER_SITE_OTHER)
Admission: RE | Admit: 2021-08-16 | Discharge: 2021-08-16 | Disposition: A | Discharge status: Home / Self Care | Payer: BC Managed Care – PPO | Source: Ambulatory Visit

## 2021-08-16 ENCOUNTER — Other Ambulatory Visit: Payer: Self-pay

## 2021-08-16 VITALS — BP 133/84 | HR 115 | Temp 99.1°F | Resp 12

## 2021-08-16 DIAGNOSIS — J019 Acute sinusitis, unspecified: Secondary | ICD-10-CM

## 2021-08-16 DIAGNOSIS — B9689 Other specified bacterial agents as the cause of diseases classified elsewhere: Secondary | ICD-10-CM | POA: Diagnosis not present

## 2021-08-16 DIAGNOSIS — R059 Cough, unspecified: Secondary | ICD-10-CM

## 2021-08-16 DIAGNOSIS — J309 Allergic rhinitis, unspecified: Secondary | ICD-10-CM | POA: Diagnosis not present

## 2021-08-16 MED ORDER — CEFDINIR 300 MG PO CAPS: 300.0000 mg | ORAL_CAPSULE | Freq: Two times a day (BID) | ORAL | 0 refills | Status: AC

## 2021-08-16 MED ORDER — FEXOFENADINE HCL 180 MG PO TABS: 180.0000 mg | ORAL_TABLET | Freq: Every day | ORAL | 0 refills | Status: DC

## 2021-08-16 MED ORDER — METHYLPREDNISOLONE SODIUM SUCC 125 MG IJ SOLR
125.0000 mg | Freq: Once | INTRAMUSCULAR | Status: AC
  Administered 2021-08-16: 125 mg via INTRAMUSCULAR

## 2021-08-16 NOTE — Discharge Instructions (Addendum)
Advised patient to take medication as directed with food to completion.  Advised/instructed patient to take Allegra daily with first dose of antibiotic for the next 7 days for concurrent postnasal drip/drainage.  May take as needed thereafter.  Encouraged patient increase daily water intake while taking these medications.

## 2021-08-16 NOTE — ED Triage Notes (Signed)
Pt presents with cough, sore throat, and congestion x2d. Pt taking dayquil, nyquil, nasal spray for sx.

## 2021-08-16 NOTE — ED Provider Notes (Signed)
[name]    CSN: [id number] Arrival date & time: 08/16/21  1452      History   Chief Complaint Chief Complaint  Patient presents with   Cough   Nasal Congestion   Sore Throat    HPI Marc Cole is a 35 y.o. male.   HPI 35 year old male presents with cough, sore throat and congestion for 2 days.  Patient reports taking NyQuil, DayQuil, nasal spray for symptoms.  Past Medical History:  Diagnosis Date   Hypertension     There are no problems to display for this patient.   History reviewed. No pertinent surgical history.     Home Medications    Prior to Admission medications   Medication Sig Start Date End Date Taking? Authorizing Provider  cefdinir (OMNICEF) 300 MG capsule Take 1 capsule (300 mg total) by mouth 2 (two) times daily for 7 days. 08/16/21 08/23/21 Yes Trevor Iha, FNP  fexofenadine [facility] ALLERGY) 180 MG tablet Take 1 tablet (180 mg total) by mouth daily for 15 days. 08/16/21 08/31/21 Yes Trevor Iha, FNP  Pseudoeph-Doxylamine-DM-APAP (NYQUIL PO) Take by mouth.   Yes [provider]  Pseudoephedrine-APAP-DM (DAYQUIL PO) Take by mouth.   Yes [provider]  acetaminophen (TYLENOL) 325 MG tablet Take 650 mg by mouth every 6 (six) hours as needed.    [provider]  cholecalciferol (VITAMIN D3) 25 MCG (1000 UNIT) tablet Take 1,000 Units by mouth daily.    [provider]  Nirmatrelvir & Ritonavir 20 x 150 MG & 10 x 100MG  TBPK TAKE 3 TABLETS BY MOUTH 2 (TWO) TIMES DAILY FOR 5 DAYS. Patient not taking: Reported on 06/24/2021 12/11/20 12/11/21  12/13/21, NP  triamcinolone cream (KENALOG) 0.1 % Apply 1 application topically 2 (two) times daily. Patient not taking: Reported on 08/16/2021 05/06/21   05/08/21, NP  vitamin C (ASCORBIC ACID) 500 MG tablet Take 1,000 mg by mouth daily. Patient not taking: Reported on 08/16/2021    [provider]  zinc gluconate 50 MG tablet Take  50 mg by mouth daily. Patient not taking: Reported on 08/16/2021    [provider]    Family History Family History  Problem Relation Age of Onset   Hypertension Father     Social History Social History   Tobacco Use   Smoking status: Every Day    Packs/day: 1.00    Types: Cigarettes   Smokeless tobacco: Current  Vaping Use   Vaping Use: Never used  Substance Use Topics   Alcohol use: Yes    Comment: 20 q wk   Drug use: No     Allergies   Trazodone and nefazodone   Review of Systems Review of Systems  HENT:  Positive for congestion, postnasal drip and sore throat.   Respiratory:  Positive for cough.   All other systems reviewed and are negative.   Physical Exam Triage Vital Signs ED Triage Vitals  Enc Vitals Group     BP 08/16/21 1459 133/84     Pulse Rate 08/16/21 1459 (!) 115     Resp 08/16/21 1459 12     Temp 08/16/21 1459 99.1 F (37.3 C)     Temp Source 08/16/21 1459 Oral     SpO2 08/16/21 1459 98 %     Weight --      Height --      Head Circumference --      Peak Flow --      Pain Score  08/16/21 1501 4     Pain Loc --      Pain Edu? --      Excl. in [place]? --    No data found.  Updated Vital Signs BP 133/84 (BP Location: Left Arm)   Pulse (!) 115   Temp 99.1 F (37.3 C) (Oral)   Resp 12   SpO2 98%      Physical Exam Vitals and nursing note reviewed.  Constitutional:      General: He is not in acute distress.    Appearance: Normal appearance. He is normal weight. He is not ill-appearing.  HENT:     Head: Normocephalic and atraumatic.     Right Ear: Tympanic membrane and external ear normal.     Left Ear: Tympanic membrane and external ear normal.     Ears:     Comments: Eustachian dysfunction tube noted bilaterally    Mouth/Throat:     Mouth: Mucous membranes are moist.     Comments: Moderate amount of clear drainage of posterior oropharynx noted Eyes:     Extraocular Movements: Extraocular movements intact.      Conjunctiva/sclera: Conjunctivae normal.     Pupils: Pupils are equal, round, and reactive to light.  Cardiovascular:     Rate and Rhythm: Normal rate and regular rhythm.     Pulses: Normal pulses.     Heart sounds: Normal heart sounds.  Pulmonary:     Effort: Pulmonary effort is normal.     Breath sounds: Normal breath sounds. No wheezing, rhonchi or rales.  Musculoskeletal:        General: Normal range of motion.     Cervical back: Normal range of motion and neck supple. No tenderness.  Lymphadenopathy:     Cervical: No cervical adenopathy.  Skin:    General: Skin is warm and dry.  Neurological:     General: No focal deficit present.     Mental Status: He is alert and oriented to person, place, and time. Mental status is at baseline.  Psychiatric:        Mood and Affect: Mood normal.        Behavior: Behavior normal.        Thought Content: Thought content normal.     UC Treatments / Results  Labs (all labs ordered are listed, but only abnormal results are displayed) Labs Reviewed - No data to display  EKG   Radiology No results found.  Procedures Procedures (including critical care time)  Medications Ordered in UC Medications  methylPREDNISolone sodium succinate (SOLU-MEDROL) 125 mg/2 mL injection 125 mg (125 mg Intramuscular Given 08/16/21 1526)    Initial Impression / Assessment and Plan / UC Course  I have reviewed the triage vital signs and the nursing notes.  Pertinent labs & imaging results that were available during my care of the patient were reviewed by me and considered in my medical decision making (see chart for details).     MDM: 1.  Acute bacterial rhinosinusitis-Rx'd Cefdinir, 2.  Allergic rhinitis, unspecified seasonality, unspecified trigger-Rx'd Allegra. Advised patient to take medication as directed with food to completion.  Advised/instructed patient to take Allegra daily with first dose of antibiotic for the next 7 days for concurrent  postnasal drip/drainage.  May take as needed thereafter.  Encouraged patient to increase daily water intake while taking these medications. 3.  Cough-IM Solu-Medrol 125 mg given once in clinic prior to discharge today.  Work note provided for the past 2 days per patient  request.  Patient discharged home, hemodynamically stable. Final Clinical Impressions(s) / UC Diagnoses   Final diagnoses:  Cough  Acute bacterial rhinosinusitis  Allergic rhinitis, unspecified seasonality, unspecified trigger     Discharge Instructions      Advised patient to take medication as directed with food to completion.  Advised/instructed patient to take Allegra daily with first dose of antibiotic for the next 7 days for concurrent postnasal drip/drainage.  May take as needed thereafter.  Encouraged patient increase daily water intake while taking these medications.     ED Prescriptions     Medication Sig Dispense Auth. Provider   cefdinir (OMNICEF) 300 MG capsule Take 1 capsule (300 mg total) by mouth 2 (two) times daily for 7 days. 14 capsule Trevor Iha, FNP   fexofenadine [facility] ALLERGY) 180 MG tablet Take 1 tablet (180 mg total) by mouth daily for 15 days. 15 tablet Trevor Iha, FNP      PDMP not reviewed this encounter.   Trevor Iha, FNP 08/16/21 [phone]

## 2022-11-19 ENCOUNTER — Ambulatory Visit
Admission: EM | Admit: 2022-11-19 | Discharge: 2022-11-19 | Disposition: A | Discharge status: Home / Self Care | Payer: BC Managed Care – PPO | Attending: Family Medicine | Admitting: Family Medicine

## 2022-11-19 ENCOUNTER — Ambulatory Visit: Admit: 2022-11-19 | Payer: BC Managed Care – PPO

## 2022-11-19 DIAGNOSIS — U071 COVID-19: Secondary | ICD-10-CM

## 2022-11-19 MED ORDER — PAXLOVID (300/100) 20 X 150 MG & 10 X 100MG PO TBPK: ORAL_TABLET | ORAL | 0 refills | Status: DC

## 2022-11-19 NOTE — ED Provider Notes (Signed)
[facility]    CSN: [id number] Arrival date & time: 11/19/22  1603      History   Chief Complaint Chief Complaint  Patient presents with   Cough    COVID pos at home 11/19/22   Chills    HPI Marc Cole is a 37 y.o. male.   HPI Patient states that he got some early symptoms on Sunday but worse on Monday.  Today is Tuesday.  He tested today and is positive for COVID.  He states that he has fever, chills, body ache, coughing, headache.  He needs a note for work.  Past Medical History:  Diagnosis Date   Hypertension     There are no problems to display for this patient.   History reviewed. No pertinent surgical history.     Home Medications    Prior to Admission medications   Medication Sig Start Date End Date Taking? Authorizing Provider  nirmatrelvir & ritonavir (PAXLOVID, 300/100,) 20 x 150 MG & 10 x 100MG  TBPK Take as directed 11/19/22  Yes Raylene Everts, MD  acetaminophen (TYLENOL) 325 MG tablet Take 650 mg by mouth every 6 (six) hours as needed.    [provider]  cholecalciferol (VITAMIN D3) 25 MCG (1000 UNIT) tablet Take 1,000 Units by mouth daily.    [provider]  Pseudoeph-Doxylamine-DM-APAP (NYQUIL PO) Take by mouth.    [provider]  Pseudoephedrine-APAP-DM (DAYQUIL PO) Take by mouth.    [provider]    Family History Family History  Problem Relation Age of Onset   Hypertension Father     Social History Social History   Tobacco Use   Smoking status: Every Day    Packs/day: 1.00    Types: Cigarettes   Smokeless tobacco: Current  Vaping Use   Vaping Use: Never used  Substance Use Topics   Alcohol use: Yes    Comment: 20 q wk   Drug use: No     Allergies   Trazodone and nefazodone   Review of Systems Review of Systems  See HPI Physical Exam Triage Vital Signs ED Triage Vitals  Enc Vitals Group     BP 11/19/22 1638 112/80     Pulse Rate 11/19/22 1638 (!) 122     Resp  11/19/22 1638 18     Temp 11/19/22 1638 98.9 F (37.2 C)     Temp Source 11/19/22 1638 Oral     SpO2 11/19/22 1638 98 %     Weight --      Height --      Head Circumference --      Peak Flow --      Pain Score 11/19/22 1639 0     Pain Loc --      Pain Edu? --      Excl. in [place]? --    No data found.  Updated Vital Signs BP 112/80 (BP Location: Left Arm)   Pulse (!) 122   Temp 98.9 F (37.2 C) (Oral)   Resp 18   SpO2 98%      Physical Exam Constitutional:      General: He is not in acute distress.    Appearance: He is well-developed. He is ill-appearing.  HENT:     Head: Normocephalic and atraumatic.  Eyes:     Conjunctiva/sclera: Conjunctivae normal.     Pupils: Pupils are equal, round, and reactive to light.  Cardiovascular:     Rate and Rhythm: Tachycardia present.  Heart sounds: Normal heart sounds.  Pulmonary:     Effort: Pulmonary effort is normal. No respiratory distress.     Breath sounds: Normal breath sounds.  Abdominal:     General: There is no distension.     Palpations: Abdomen is soft.  Musculoskeletal:        General: Normal range of motion.     Cervical back: Normal range of motion.  Skin:    General: Skin is warm and dry.  Neurological:     Mental Status: He is alert.      UC Treatments / Results  Labs (all labs ordered are listed, but only abnormal results are displayed) Labs Reviewed - No data to display  EKG   Radiology No results found.  Procedures Procedures (including critical care time)  Medications Ordered in UC Medications - No data to display  Initial Impression / Assessment and Plan / UC Course  I have reviewed the triage vital signs and the nursing notes.  Pertinent labs & imaging results that were available during my care of the patient were reviewed by me and considered in my medical decision making (see chart for details).     COVID test is positive at home.  I am certain this is accurate.  Will treat him  accordingly. Final Clinical Impressions(s) / UC Diagnoses   Final diagnoses:  [id number]     Discharge Instructions      Lots of fluids May take over-the-counter cough and cold medicine as needed Take ibuprofen or Tylenol for pain and fever Take Paxlovid 2 times a day for 5 days You must quarantine for 5 days.  Today is day 2.  Wear a mask for 5 days.   ED Prescriptions     Medication Sig Dispense Auth. Provider   nirmatrelvir & ritonavir (PAXLOVID, 300/100,) 20 x 150 MG & 10 x 100MG  TBPK Take as directed 1 each Meda Coffee Jennette Banker, MD      PDMP not reviewed this encounter.   Raylene Everts, MD 11/19/22 [phone]

## 2022-11-19 NOTE — ED Triage Notes (Signed)
Pt c/o cough, headache and chills since Sunday evening. Tested pos for covid at home today. Dayquil and nyquil prn.

## 2022-11-19 NOTE — Discharge Instructions (Addendum)
Lots of fluids May take over-the-counter cough and cold medicine as needed Take ibuprofen or Tylenol for pain and fever Take Paxlovid 2 times a day for 5 days You must quarantine for 5 days.  Today is day 2.  Wear a mask for 5 days.

## 2022-11-20 ENCOUNTER — Telehealth: Payer: Self-pay | Admitting: Emergency Medicine

## 2022-11-20 NOTE — Telephone Encounter (Signed)
Spoke with patient states that he's feeling about the same.  He hasn't been able to get his prescription due to pharmacies being out of medication.  Will continue to rest and hydrate as much as possible.

## 2024-11-22 ENCOUNTER — Ambulatory Visit
Admission: EM | Admit: 2024-11-22 | Discharge: 2024-11-22 | Disposition: A | Discharge status: Home / Self Care | Attending: Family Medicine | Admitting: Family Medicine

## 2024-11-22 DIAGNOSIS — J209 Acute bronchitis, unspecified: Secondary | ICD-10-CM | POA: Diagnosis not present

## 2024-11-22 MED ORDER — PROMETHAZINE-DM 6.25-15 MG/5ML PO SYRP: 5.0000 mL | ORAL_SOLUTION | Freq: Four times a day (QID) | ORAL | 0 refills | Status: AC | PRN

## 2024-11-22 MED ORDER — METHYLPREDNISOLONE ACETATE 80 MG/ML IJ SUSP
80.0000 mg | Freq: Once | INTRAMUSCULAR | Status: AC
  Administered 2024-11-22: 80 mg via INTRAMUSCULAR

## 2024-11-22 MED ORDER — AMOXICILLIN-POT CLAVULANATE 875-125 MG PO TABS: 1.0000 | ORAL_TABLET | Freq: Two times a day (BID) | ORAL | 0 refills | Status: AC

## 2024-11-22 MED ORDER — ALBUTEROL SULFATE (2.5 MG/3ML) 0.083% IN NEBU
2.5000 mg | INHALATION_SOLUTION | Freq: Once | RESPIRATORY_TRACT | Status: AC
  Administered 2024-11-22: 2.5 mg via RESPIRATORY_TRACT

## 2024-11-22 NOTE — ED Provider Notes (Signed)
 " [facility]    CSN: [id number] Arrival date & time: 11/22/24  1818      History   Chief Complaint Chief Complaint  Patient presents with   Cough    HPI Chaska Hagger is a 39 y.o. male.   Patient had influenza last week.  8 days ago he had a positive flu test.  He was very sick with flu body aches fever chills and cough.  He states he was starting to feel a lot better and went back to work yesterday.  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  States he woke up today and he has coughing, congestion, shortness of breath, and fever.  He does not know if it is a new infection or if it is a relapse of his influenza.  He was unable to work today.  He is a non-smoker.  No underlying lung disease    Past Medical History:  Diagnosis Date   Hypertension     There are no active problems to display for this patient.   History reviewed. No pertinent surgical history.     Home Medications    Prior to Admission medications  Medication Sig Start Date End Date Taking? Authorizing Provider  amoxicillin -clavulanate (AUGMENTIN ) 875-125 MG tablet Take 1 tablet by mouth every 12 (twelve) hours. 11/22/24  Yes Maranda Jamee Jacob, MD  promethazine -dextromethorphan (PROMETHAZINE -DM) 6.25-15 MG/5ML syrup Take 5 mLs by mouth 4 (four) times daily as needed for cough. 11/22/24  Yes Maranda Jamee Jacob, MD    Family History Family History  Problem Relation Age of Onset   Hypertension Father     Social History Social History[1]   Allergies   Trazodone and nefazodone   Review of Systems Review of Systems See HPI  Physical Exam Triage Vital Signs ED Triage Vitals  Encounter Vitals Group     BP 11/22/24 1934 116/72     Girls Systolic BP Percentile --      Girls Diastolic BP Percentile --      Boys Systolic BP Percentile --      Boys Diastolic BP Percentile --      Pulse Rate 11/22/24 1934 99     Resp 11/22/24 1934 17     Temp 11/22/24 1934 99.2 F  (37.3 C)     Temp Source 11/22/24 1934 Oral     SpO2 11/22/24 1934 96 %     Weight 11/22/24 1931 190 lb (86.2 kg)     Height 11/22/24 1931 5' 7 (1.702 m)     Head Circumference --      Peak Flow --      Pain Score 11/22/24 1930 10     Pain Loc --      Pain Education --      Exclude from Growth Chart --    No data found.  Updated Vital Signs BP 116/72 (BP Location: Right Arm)   Pulse 99   Temp 99.2 F (37.3 C) (Oral)   Resp 17   Ht 5' 7 (1.702 m)   Wt 86.2 kg   SpO2 96%   BMI 29.76 kg/m       Physical Exam Vitals reviewed.  Constitutional:      General: He is not in acute distress.    Appearance: He is well-developed.  HENT:     Head: Normocephalic and atraumatic.     Right Ear: Tympanic membrane normal.     Left Ear: Tympanic  membrane normal.     Nose: Nose normal. No congestion.     Mouth/Throat:     Pharynx: Posterior oropharyngeal erythema present.  Eyes:     Conjunctiva/sclera: Conjunctivae normal.     Pupils: Pupils are equal, round, and reactive to light.  Cardiovascular:     Rate and Rhythm: Normal rate and regular rhythm.     Heart sounds: Normal heart sounds.  Pulmonary:     Effort: Pulmonary effort is normal. No respiratory distress.     Breath sounds: Wheezing and rhonchi present.     Comments: Rhonchi and wheeze throughout.  Frequent cough Abdominal:     General: There is no distension.     Palpations: Abdomen is soft.  Musculoskeletal:        General: Normal range of motion.     Cervical back: Normal range of motion.  Lymphadenopathy:     Cervical: No cervical adenopathy.  Skin:    General: Skin is warm and dry.  Neurological:     Mental Status: He is alert.      UC Treatments / Results  Labs (all labs ordered are listed, but only abnormal results are displayed) Labs Reviewed - No data to display  EKG   Radiology No results found.  Procedures Procedures (including critical care time)  Medications Ordered in  UC Medications  albuterol  (PROVENTIL ) (2.5 MG/3ML) 0.083% nebulizer solution 2.5 mg (2.5 mg Nebulization Given 11/22/24 1956)  methylPREDNISolone  acetate (DEPO-MEDROL ) injection 80 mg (80 mg Intramuscular Given 11/22/24 2014)    Initial Impression / Assessment and Plan / UC Course  I have reviewed the triage vital signs and the nursing notes.  Pertinent labs & imaging results that were available during my care of the patient were reviewed by me and considered in my medical decision making (see chart for details).     Discussed that this could be a relapse of his influenza versus a bacterial bronchitis. Final Clinical Impressions(s) / UC Diagnoses   Final diagnoses:  Acute bronchitis, unspecified organism     Discharge Instructions      Drink lots of fluids Take the antibiotic 2 times a day Take cough medicine 4 times a day as needed.  This will cause drowsiness.  You need rest The steroid shot will last several days See your doctor if not improving by the end of the week     ED Prescriptions     Medication Sig Dispense Auth. Provider   amoxicillin -clavulanate (AUGMENTIN ) 875-125 MG tablet Take 1 tablet by mouth every 12 (twelve) hours. 14 tablet Maranda Jamee Jacob, MD   promethazine -dextromethorphan (PROMETHAZINE -DM) 6.25-15 MG/5ML syrup Take 5 mLs by mouth 4 (four) times daily as needed for cough. 118 mL Maranda Jamee Jacob, MD      PDMP not reviewed this encounter.     [1]  Social History Tobacco Use   Smoking status: Every Day    Current packs/day: 1.00    Types: Cigarettes   Smokeless tobacco: Current  Vaping Use   Vaping status: Never Used  Substance Use Topics   Alcohol use: Yes    Comment: soc   Drug use: No     Maranda Jamee Jacob, MD 11/22/24 2015  "

## 2024-11-22 NOTE — Discharge Instructions (Addendum)
 Drink lots of fluids Take the antibiotic 2 times a day Take cough medicine 4 times a day as needed.  This will cause drowsiness.  You need rest The steroid shot will last several days See your doctor if not improving by the end of the week

## 2024-11-22 NOTE — ED Triage Notes (Signed)
 Pt states that he has a cough and chest congestion. Pt states that his chest and throat hurts when he coughs. X1 week  Pt states that he took a Flu test x1 week ago when his symptoms first started and it was positive.   Pt states that he has taken Delsym and Sudafed otc medications.
